# Patient Record
Sex: Male | Born: 1965 | Race: White | Hispanic: No | Marital: Single | Smoking: Never smoker
Health system: Southern US, Community
[De-identification: ages and names within clinical notes are randomized; demographics above are authoritative.]

## PROBLEM LIST (undated history)

## (undated) DIAGNOSIS — K219 Gastro-esophageal reflux disease without esophagitis: Secondary | ICD-10-CM

## (undated) DIAGNOSIS — J45909 Unspecified asthma, uncomplicated: Secondary | ICD-10-CM

## (undated) DIAGNOSIS — R030 Elevated blood-pressure reading, without diagnosis of hypertension: Secondary | ICD-10-CM

## (undated) HISTORY — PX: APPENDECTOMY: SHX54

---

## 2013-06-07 ENCOUNTER — Inpatient Hospital Stay: Payer: Self-pay | Admitting: Student

## 2013-06-07 LAB — URINALYSIS, COMPLETE
BACTERIA: NONE SEEN
BILIRUBIN, UR: NEGATIVE
BLOOD: NEGATIVE
GLUCOSE, UR: NEGATIVE mg/dL (ref 0–75)
Ketone: NEGATIVE
Leukocyte Esterase: NEGATIVE
NITRITE: NEGATIVE
Ph: 7 (ref 4.5–8.0)
Protein: NEGATIVE
RBC,UR: <1 /HPF (ref 0–5)
Specific Gravity: 1.004 (ref 1.003–1.030)
Squamous Epithelial: NONE SEEN
WBC UR: NONE SEEN /HPF (ref 0–5)

## 2013-06-07 LAB — CBC WITH DIFFERENTIAL/PLATELET
BASOS PCT: 0.5 %
Basophil #: 0.1 10*3/uL (ref 0.0–0.1)
Eosinophil #: 0 10*3/uL (ref 0.0–0.7)
Eosinophil %: 0.1 %
HCT: 39.9 % — AB (ref 40.0–52.0)
HGB: 13.3 g/dL (ref 13.0–18.0)
LYMPHS ABS: 0.4 10*3/uL — AB (ref 1.0–3.6)
LYMPHS PCT: 3.2 %
MCH: 31.8 pg (ref 26.0–34.0)
MCHC: 33.4 g/dL (ref 32.0–36.0)
MCV: 95 fL (ref 80–100)
Monocyte #: 0.9 x10 3/mm (ref 0.2–1.0)
Monocyte %: 7.1 %
NEUTROS ABS: 11.2 10*3/uL — AB (ref 1.4–6.5)
Neutrophil %: 89.1 %
PLATELETS: 165 10*3/uL (ref 150–440)
RBC: 4.19 10*6/uL — ABNORMAL LOW (ref 4.40–5.90)
RDW: 13.1 % (ref 11.5–14.5)
WBC: 12.6 10*3/uL — AB (ref 3.8–10.6)

## 2013-06-07 LAB — COMPREHENSIVE METABOLIC PANEL
ANION GAP: 6 — AB (ref 7–16)
AST: 49 U/L — AB (ref 15–37)
Albumin: 3.5 g/dL (ref 3.4–5.0)
Alkaline Phosphatase: 76 U/L
BUN: 10 mg/dL (ref 7–18)
Bilirubin,Total: 0.6 mg/dL (ref 0.2–1.0)
CALCIUM: 7.9 mg/dL — AB (ref 8.5–10.1)
CO2: 26 mmol/L (ref 21–32)
Chloride: 106 mmol/L (ref 98–107)
Creatinine: 1.21 mg/dL (ref 0.60–1.30)
EGFR (Non-African Amer.): >60
Glucose: 120 mg/dL — ABNORMAL HIGH (ref 65–99)
Osmolality: 276 (ref 275–301)
Potassium: 3.8 mmol/L (ref 3.5–5.1)
SGPT (ALT): 48 U/L (ref 12–78)
SODIUM: 138 mmol/L (ref 136–145)
Total Protein: 6.6 g/dL (ref 6.4–8.2)

## 2013-06-07 LAB — RAPID INFLUENZA A&B ANTIGENS

## 2013-06-07 LAB — TSH: Thyroid Stimulating Horm: 0.771 u[IU]/mL

## 2013-06-09 LAB — BETA STREP CULTURE(ARMC)

## 2013-06-09 LAB — CREATININE, SERUM
CREATININE: 1.11 mg/dL (ref 0.60–1.30)
EGFR (African American): >60
EGFR (Non-African Amer.): >60

## 2013-06-09 LAB — WBC: WBC: 13.2 10*3/uL — ABNORMAL HIGH (ref 3.8–10.6)

## 2013-06-12 LAB — CULTURE, BLOOD (SINGLE)

## 2014-06-30 NOTE — H&P (Signed)
PATIENT NAME:  Antonio Parks Parks, Antonio Parks MR#:  [id number] DATE OF BIRTH:  07/02/1965  DATE OF ADMISSION:  06/07/2013  ADMITTING PHYSICIAN: Enid Baasadhika Taylin Leder, MD  PRIMARY CARE PHYSICIAN: None.   CHIEF COMPLAINT: Cough, congestion and fever.   HISTORY OF PRESENT ILLNESS: Antonio Parks Parks is a 49 year old Caucasian male with past medical history significant for childhood asthma and alcohol abuse, who presents to the hospital secondary to sore throat, cough, congestion and fevers. He states his symptoms started with cough, congestion and sore throat with voice change about 2 days ago, but the fever started last night. This morning, he felt extremely weak, drained out and presents to the ER. He was noted to have a fever of 101 degrees Fahrenheit at home. He is tachycardic significantly in the 120s in spite of 2 liters of fluids. He did have a white count at 12.6. He is being admitted for systemic inflammatory response syndrome at this time. UA is pending. Chest x-ray is negative for any acute infection.   PAST MEDICAL HISTORY: Childhood asthma.   PAST SURGICAL HISTORY: Appendectomy.   ALLERGIES TO MEDICATIONS: PENICILLIN.   CURRENT HOME MEDICATIONS: Takes BC Powders over the counter 1 packet every day, but has stopped for 2 weeks now.   SOCIAL HISTORY: Lives at home by himself. Does not smoke. Drinks about 3 to 4 beers every day, and on weekends, he drinks about 18 beers. 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   FAMILY HISTORY: Parents healthy. Grandmother with diabetes and heart problems.   REVIEW OF SYSTEMS:  CONSTITUTIONAL: Positive for fever, fatigue and weakness.  EYES: No blurred vision, double vision, inflammation or glaucoma.  ENT: No tinnitus, ear pain, hearing loss, epistaxis or discharge. Positive for sore throat and hoarse voice.  RESPIRATORY: Positive for cough and congestion. No dyspnea on exertion. No asthma or COPD. No hemoptysis.  CARDIOVASCULAR: No chest pain, orthopnea,  edema, arrhythmia, palpitations or syncope.  GASTROINTESTINAL: No nausea, vomiting, diarrhea, abdominal pain, hematemesis or melena.  GENITOURINARY: No dysuria, hematuria, renal calculus, frequency or incontinence.  ENDOCRINE: No polyuria, nocturia, thyroid problems, heat or cold intolerance.  HEMATOLOGY: No anemia, easy bruising or bleeding.  SKIN: No acne, rash or lesions.  MUSCULOSKELETAL: Positive for generalized body aches, but no arthritis or gout.  NEUROLOGICAL: No numbness, weakness, CVA, TIA or seizures.  PSYCHOLOGICAL: No anxiety, insomnia or depression.   PHYSICAL EXAMINATION:  VITAL SIGNS: Temperature 98.3 degrees Fahrenheit, pulse 120, respirations 18, blood pressure 180/87, pulse of 100% on room air.  GENERAL: Well-built, well-nourished male lying in bed, not in any acute distress.  HEENT: Normocephalic, atraumatic. Pupils equal, round and reacting to light. Anicteric sclerae. Extraocular movements intact. Oropharynx is clear, without erythema, mass or exudates. Right ear has only mild fluid seen, and the left ear has fluid which is yellowish and possible otitis media.  NECK: Supple. No thyromegaly, JVD or carotid bruits. No lymphadenopathy. Normal range of motion without any pain.  RESPIRATORY: No wheeze or crackles. No use of accessory muscles for breathing. Normal breath sounds bilaterally.  CARDIOVASCULAR: S1 and S2, rapid rate and regular rhythm. No murmurs, rubs or gallops.  ABDOMEN: Soft, nontender, nondistended. No hepatosplenomegaly. Normal bowel sounds.  EXTREMITIES: No pedal edema. No clubbing or cyanosis. Has 2+ dorsalis pedis pulses palpable bilaterally.  SKIN: No acne, rash or lesions.  LYMPHATICS: No cervical or inguinal lymphadenopathy.  NEUROLOGICAL: Cranial nerves II through XII remain intact. No gross motor or sensory deficits.  PSYCHOLOGICAL: The patient is awake, alert, oriented  x3.   LABORATORY DATA:  WBC 12.6, hemoglobin 13.3, hematocrit 39.9, platelet  count 165.  Sodium 138, potassium 3.8, chloride 106, bicarbonate 26, BUN 10, creatinine 1.21, glucose of 120 and calcium of 7.9.  ALT 48, AST 49, alkaline phosphatase 76, total bilirubin 0.6, albumin of 3.5. TSH is 0.771.  Chest x-ray showing clear lung fields, hyperinflation. No acute cardiopulmonary disease. Urinalysis is negative for any infection.   ASSESSMENT AND PLAN: A 49 year old male with no significant past medical history, presents with fever and sore throat, noted to be tachycardic and elevated WBC.   1. Systemic inflammatory response syndrome. Flu test is pending, and also strep throat has been ordered. Also noted left otitis media early changes. Blood cultures done. IV fluids ordered and start Levaquin.  2. Melena, uses BC Powders as outpatient, recently stopped for two weeks. Stool normal color now. Hemoglobin is stable. Counseled against BC Powders. Will just start on Protonix for reflux symptoms while in the hospital.  3. Alcohol abuse. Will place on CIWA protocol. No history of any withdrawals.   CODE STATUS: Full code.  TIME SPENT ON ADMISSION: 50 minutes.   ____________________________ Enid Baas, MD rk:lb D: 06/07/2013 11:51:42 ET T: 06/07/2013 12:28:04 ET JOB#: [id number]  cc: Enid Baas, MD, <Dictator> Enid Baas MD ELECTRONICALLY SIGNED 06/15/2013 14:04

## 2014-06-30 NOTE — Discharge Summary (Signed)
PATIENT NAME:  Antonio Parks, Antonio Parks MR#:  [id number] DATE OF BIRTH:  31-Jul-1965  DATE OF ADMISSION:  06/07/2013  DATE OF DISCHARGE:  06/09/2013  CHIEF COMPLAINT:  Cough, congestion, and fever.  DISCHARGE DIAGNOSES: 1.  Systemic inflammatory response syndrome. 2.  Possible left otitis media.  3.  Constipation.  4.  Black stools x 1, possible melena.   5.  Alcohol abuse.   DISCHARGE MEDICATIONS:  1.  Levaquin 500 mg daily for 4 days, then stop.  2.  Omeprazole 20 mg daily for 21 days.  3.  Dok Plus 50/8.6 mg 2 tabs once a day at bedtime.   INSTRUCTIONS: Diet: Regular. Activity as tolerated. Please follow with PCP within 1 to 2 weeks. If you have any further dark stools, call your doctor right away.   DISPOSITION: Home.   SIGNIFICANT LABS AND IMAGING: Blood cultures on arrival: No growth to date. Rapid flu negative. Strep in the throat was negative. Urinalysis does not suggest an infection. Initial white count of 12.6, hemoglobin 13.3. TSH 0.771. X-ray of the chest, PA and lateral:  No active disease, minimal hyperinflation.   HISTORY OF PRESENT ILLNESS AND HOSPITAL COURSE: For full details of the H and P, please see the dictation on April 1 by Dr. Nemiah Commander, but briefly, this is a pleasant, 49 year old with no significant chronic medical issues, history of childhood asthma, and alcohol abuse, who came in with sore throat, cough, congestion, and fevers. He did have SIRS by criteria, with fever, tachycardia, and some leukocytosis, but negative x-ray of the chest. He was admitted to the hospitalist service. Of note, he also had been drinking 3 or 4 beers daily, and much more over the weekend, about 18 beers. He was admitted to the hospitalist service. On arrival, he did appear to have left otitis media. The systemic inflammatory response syndrome could have been viral in nature, and otitis media a complication. However, here, the blood cultures have been negative. Urinalysis was negative, and rapid flu  and strep throat have been negative. Symptoms have resolved. He has been afebrile, with significant improvement in his tachycardia from 120s. He has been placed on CIWA, but has not shown significant withdrawal from alcohol. His pulse rate is better, and he will be discharged with the above medications. Of note, the patient did have a complaint of black stools x 1, which has not recurred. He was counseled to cut back on his alcohol. He has also been taking excessive BC powder, and he was counseled against that, and he was instructed if he has recurrent black stools to call Doctor. He will be discharged with some PPI, and he did complain of constipation. Will be discharged on some stool softeners.   PHYSICAL EXAMINATION: VITAL SIGNS: On the day of discharge, temperature is 98.1, pulse rate 97, respiratory rate 20, blood pressure 136/92, O2 sat 97% on room air.  GENERAL: The patient is a pleasant male lying in bed without significant tremors, talking in full sentences.  LUNGS: Pretty much clear.  ABDOMEN: Soft nontender. Normal S1, S2. A little tachycardic.  EXTREMITIES: Do not exhibit any edema.  NEUROLOGIC:  He is awake, alert, oriented x 3, without significant tremors or anxiety.   PLAN: He will be discharged to outpatient followup.   Total time spent is 32 minutes.   The patient is a FULL CODE.    ____________________________ Krystal Eaton, MD sa:mr D: 06/09/2013 15:20:45 ET T: 06/09/2013 21:23:03 ET JOB#: [id number]  cc: Krystal Eaton, MD, <Dictator> [facility]  MD ELECTRONICALLY SIGNED 07/06/2013 14:17

## 2015-09-11 ENCOUNTER — Emergency Department
Admission: EM | Admit: 2015-09-11 | Discharge: 2015-09-11 | Disposition: A | Discharge status: Home / Self Care | Payer: PRIVATE HEALTH INSURANCE | Attending: Emergency Medicine | Admitting: Emergency Medicine

## 2015-09-11 DIAGNOSIS — W260XXA Contact with knife, initial encounter: Secondary | ICD-10-CM | POA: Insufficient documentation

## 2015-09-11 DIAGNOSIS — S61012A Laceration without foreign body of left thumb without damage to nail, initial encounter: Secondary | ICD-10-CM

## 2015-09-11 DIAGNOSIS — Y929 Unspecified place or not applicable: Secondary | ICD-10-CM | POA: Diagnosis not present

## 2015-09-11 DIAGNOSIS — Y9389 Activity, other specified: Secondary | ICD-10-CM | POA: Diagnosis not present

## 2015-09-11 DIAGNOSIS — Y999 Unspecified external cause status: Secondary | ICD-10-CM | POA: Diagnosis not present

## 2015-09-11 MED ORDER — TETANUS-DIPHTH-ACELL PERTUSSIS 5-2.5-18.5 LF-MCG/0.5 IM SUSP
0.5000 mL | Freq: Once | INTRAMUSCULAR | Status: AC
  Administered 2015-09-11: 0.5 mL via INTRAMUSCULAR
  Filled 2015-09-11: qty 0.5

## 2015-09-11 NOTE — ED Provider Notes (Signed)
[facility] Emergency Department Provider Note ____________________________________________  Time seen: 2130  I have reviewed the triage vital signs and the nursing notes.  HISTORY  Chief Complaint  Laceration  HPI Karma GanjaMichael Leach is a 50 y.o. male visits to the ED for evaluation of the left thumb laceration accidentally obtained while he was using a steak knife to cut tomatoes. He denies any other injury at this time. The patient is unclear for current tetanus status. He presents to the ED with a wound dressed with a Band-Aid and bloodsoaked gauze. He denies any significant pain at this time.  No past medical history on file.  There are no active problems to display for this patient.  No past surgical history on file.  No current outpatient prescriptions on file.  Allergies Penicillins  No family history on file.  Social History Social History  Substance Use Topics  . Smoking status: Not on file  . Smokeless tobacco: Not on file  . Alcohol Use: Not on file   Review of Systems  Constitutional: Negative for fever. Musculoskeletal: Negative for back pain. Skin: Negative for rash. Left finger laceration. Neurological: Negative for headaches, focal weakness or numbness. ____________________________________________  PHYSICAL EXAM:  VITAL SIGNS: ED Triage Vitals  Enc Vitals Group     BP 09/11/15 2025 176/114 mmHg     Pulse Rate 09/11/15 2025 84     Resp 09/11/15 2025 18     Temp 09/11/15 2025 98.2 F (36.8 C)     Temp Source 09/11/15 2025 Oral     SpO2 09/11/15 2025 99 %     Weight 09/11/15 2025 170 lb (77.111 kg)     Height 09/11/15 2025 5\' 11"  (1.803 m)     Head Cir --      Peak Flow --      Pain Score 09/11/15 2109 0     Pain Loc --      Pain Edu? --      Excl. in GC? --    Constitutional: Alert and oriented. Well appearing and in no distress. Head: Normocephalic and atraumatic. Respiratory: Normal respiratory effort.   Musculoskeletal: Normal composite fist and the left thumb reveals a superficial one similar flap laceration of the distal plantar's fat pad. Wound is clean and bleeding is currently controlled. Nontender with normal range of motion in all extremities.  Neurologic:  Normal gross sensation. No gross focal neurologic deficits are appreciated. Skin:  Skin is warm, dry and intact. No rash noted. ___________________________________________  PROCEDURES  Tdap  LACERATION REPAIR Performed by: Lissa HoardMenshew, Whalen Trompeter V Bacon Authorized by: Lissa HoardMenshew, Dearis Danis V Bacon Consent: Verbal consent obtained. Risks and benefits: risks, benefits and alternatives were discussed Consent given by: patient Patient identity confirmed: provided demographic data Prepped and Draped in normal sterile fashion Wound explored  Laceration Location: left thumb  Laceration Length: 1 cm  No Foreign Bodies seen or palpated  Anesthesia: none  Irrigation method: syringe Amount of cleaning: standard  Skin closure: cyanoacrylate wound adhesive  Patient tolerance: Patient tolerated the procedure well with no immediate complications. ____________________________________________  INITIAL IMPRESSION / ASSESSMENT AND PLAN / ED COURSE  Patient with a superficial laceration to the left thumb status post wound adhesive closure. Patient's wound is dressed as appropriate and is discharged following a tetanus booster. He should follow-up with his primary care provider or the local community clinics for ongoing symptom management. ____________________________________________  FINAL CLINICAL IMPRESSION(S) / ED DIAGNOSES  Final diagnoses:  Thumb laceration, left, initial encounter  Charlesetta IvoryJenise V Bacon DarienMenshew, PA-C 09/11/15 2202  Jeanmarie PlantJames A McShane, MD 09/11/15 [phone]

## 2015-09-11 NOTE — Discharge Instructions (Signed)
Laceration Care, Adult  A laceration is a cut that goes through all layers of the skin. The cut also goes into the tissue that is right under the skin. Some cuts heal on their own. Others need to be closed with stitches (sutures), staples, skin adhesive strips, or wound glue. Taking care of your cut lowers your risk of infection and helps your cut to heal better.  HOW TO TAKE CARE OF YOUR CUT  For stitches or staples:  · Keep the wound clean and dry.  · If you were given a bandage (dressing), you should change it at least one time per day or as told by your doctor. You should also change it if it gets wet or dirty.  · Keep the wound completely dry for the first 24 hours or as told by your doctor. After that time, you may take a shower or a bath. However, make sure that the wound is not soaked in water until after the stitches or staples have been removed.  · Clean the wound one time each day or as told by your doctor:    Wash the wound with soap and water.    Rinse the wound with water until all of the soap comes off.    Pat the wound dry with a clean towel. Do not rub the wound.  · After you clean the wound, put a thin layer of antibiotic ointment on it as told by your doctor. This ointment:    Helps to prevent infection.    Keeps the bandage from sticking to the wound.  · Have your stitches or staples removed as told by your doctor.  If your doctor used skin adhesive strips:   · Keep the wound clean and dry.  · If you were given a bandage, you should change it at least one time per day or as told by your doctor. You should also change it if it gets dirty or wet.  · Do not get the skin adhesive strips wet. You can take a shower or a bath, but be careful to keep the wound dry.  · If the wound gets wet, pat it dry with a clean towel. Do not rub the wound.  · Skin adhesive strips fall off on their own. You can trim the strips as the wound heals. Do not remove any strips that are still stuck to the wound. They will  fall off after a while.  If your doctor used wound glue:  · Try to keep your wound dry, but you may briefly wet it in the shower or bath. Do not soak the wound in water, such as by swimming.  · After you take a shower or a bath, gently pat the wound dry with a clean towel. Do not rub the wound.  · Do not do any activities that will make you really sweaty until the skin glue has fallen off on its own.  · Do not apply liquid, cream, or ointment medicine to your wound while the skin glue is still on.  · If you were given a bandage, you should change it at least one time per day or as told by your doctor. You should also change it if it gets dirty or wet.  · If a bandage is placed over the wound, do not let the tape for the bandage touch the skin glue.  · Do not pick at the glue. The skin glue usually stays on for 5-10 days. Then, it   falls off of the skin.  General Instructions   · To help prevent scarring, make sure to cover your wound with sunscreen whenever you are outside after stitches are removed, after adhesive strips are removed, or when wound glue stays in place and the wound is healed. Make sure to wear a sunscreen of at least 30 SPF.  · Take over-the-counter and prescription medicines only as told by your doctor.  · If you were given antibiotic medicine or ointment, take or apply it as told by your doctor. Do not stop using the antibiotic even if your wound is getting better.  · Do not scratch or pick at the wound.  · Keep all follow-up visits as told by your doctor. This is important.  · Check your wound every day for signs of infection. Watch for:    Redness, swelling, or pain.    Fluid, blood, or pus.  · Raise (elevate) the injured area above the level of your heart while you are sitting or lying down, if possible.  GET HELP IF:  · You got a tetanus shot and you have any of these problems at the injection site:    Swelling.    Very bad pain.    Redness.    Bleeding.  · You have a fever.  · A wound that was  closed breaks open.  · You notice a bad smell coming from your wound or your bandage.  · You notice something coming out of the wound, such as wood or glass.  · Medicine does not help your pain.  · You have more redness, swelling, or pain at the site of your wound.  · You have fluid, blood, or pus coming from your wound.  · You notice a change in the color of your skin near your wound.  · You need to change the bandage often because fluid, blood, or pus is coming from the wound.  · You start to have a new rash.  · You start to have numbness around the wound.  GET HELP RIGHT AWAY IF:  · You have very bad swelling around the wound.  · Your pain suddenly gets worse and is very bad.  · You notice painful lumps near the wound or on skin that is anywhere on your body.  · You have a red streak going away from your wound.  · The wound is on your hand or foot and you cannot move a finger or toe like you usually can.  · The wound is on your hand or foot and you notice that your fingers or toes look pale or bluish.     This information is not intended to replace advice given to you by your health care provider. Make sure you discuss any questions you have with your health care provider.     Document Released: 08/12/2007 Document Revised: 07/10/2014 Document Reviewed: 02/19/2014  Elsevier Interactive Patient Education ©2016 [employer]

## 2015-09-11 NOTE — ED Notes (Signed)
Laceration to left thumb while cutting a tomato.

## 2015-09-11 NOTE — ED Notes (Signed)
Pt presents to ED with c/o laceration to left thumb while cutting tomatoes with a steak knife. Wound is not visualized at this time, pt presents with gauze and kerlex wrapped around wound; bleeding controlled at this time. Pt admits to ETOH use prior to injury. Pt unsure of last Tetanus shot, but reports last time he was seen in the ED was 3+ yrs ago.

## 2016-12-24 ENCOUNTER — Emergency Department: Payer: PRIVATE HEALTH INSURANCE

## 2016-12-24 ENCOUNTER — Emergency Department
Admission: EM | Admit: 2016-12-24 | Discharge: 2016-12-24 | Disposition: A | Discharge status: Home / Self Care | Payer: PRIVATE HEALTH INSURANCE | Attending: Emergency Medicine | Admitting: Emergency Medicine

## 2016-12-24 DIAGNOSIS — E860 Dehydration: Secondary | ICD-10-CM | POA: Insufficient documentation

## 2016-12-24 DIAGNOSIS — R197 Diarrhea, unspecified: Secondary | ICD-10-CM | POA: Diagnosis present

## 2016-12-24 DIAGNOSIS — K5289 Other specified noninfective gastroenteritis and colitis: Secondary | ICD-10-CM | POA: Diagnosis not present

## 2016-12-24 DIAGNOSIS — R112 Nausea with vomiting, unspecified: Secondary | ICD-10-CM | POA: Diagnosis not present

## 2016-12-24 HISTORY — DX: Unspecified asthma, uncomplicated: J45.909

## 2016-12-24 LAB — COMPREHENSIVE METABOLIC PANEL
ALBUMIN: 2.9 g/dL — AB (ref 3.5–5.0)
ALT: 24 U/L (ref 17–63)
ANION GAP: 10 (ref 5–15)
AST: 20 U/L (ref 15–41)
Alkaline Phosphatase: 85 U/L (ref 38–126)
BUN: 9 mg/dL (ref 6–20)
CHLORIDE: 92 mmol/L — AB (ref 101–111)
CO2: 27 mmol/L (ref 22–32)
Calcium: 8.2 mg/dL — ABNORMAL LOW (ref 8.9–10.3)
Creatinine, Ser: 1.06 mg/dL (ref 0.61–1.24)
GFR calc Af Amer: >60 mL/min (ref >60)
GLUCOSE: 118 mg/dL — AB (ref 65–99)
POTASSIUM: 3.2 mmol/L — AB (ref 3.5–5.1)
Sodium: 129 mmol/L — ABNORMAL LOW (ref 135–145)
TOTAL PROTEIN: 7.1 g/dL (ref 6.5–8.1)
Total Bilirubin: 0.8 mg/dL (ref 0.3–1.2)

## 2016-12-24 LAB — URINALYSIS, COMPLETE (UACMP) WITH MICROSCOPIC
BILIRUBIN URINE: NEGATIVE
Glucose, UA: NEGATIVE mg/dL
Ketones, ur: 20 mg/dL — AB
NITRITE: NEGATIVE
PROTEIN: 100 mg/dL — AB
Specific Gravity, Urine: 1.021 (ref 1.005–1.030)
pH: 6 (ref 5.0–8.0)

## 2016-12-24 LAB — CBC
HEMATOCRIT: 37.5 % — AB (ref 40.0–52.0)
HEMOGLOBIN: 12.8 g/dL — AB (ref 13.0–18.0)
MCH: 31.8 pg (ref 26.0–34.0)
MCHC: 34.1 g/dL (ref 32.0–36.0)
MCV: 93.4 fL (ref 80.0–100.0)
Platelets: 472 10*3/uL — ABNORMAL HIGH (ref 150–440)
RBC: 4.02 MIL/uL — ABNORMAL LOW (ref 4.40–5.90)
RDW: 12.8 % (ref 11.5–14.5)
WBC: 17.4 10*3/uL — AB (ref 3.8–10.6)

## 2016-12-24 LAB — MAGNESIUM: MAGNESIUM: 2.1 mg/dL (ref 1.7–2.4)

## 2016-12-24 LAB — LIPASE, BLOOD: LIPASE: 68 U/L — AB (ref 11–51)

## 2016-12-24 MED ORDER — IOPAMIDOL (ISOVUE-300) INJECTION 61%
100.0000 mL | Freq: Once | INTRAVENOUS | Status: AC | PRN
  Administered 2016-12-24: 100 mL via INTRAVENOUS

## 2016-12-24 MED ORDER — SODIUM CHLORIDE 0.9 % IV BOLUS (SEPSIS)
1000.0000 mL | Freq: Once | INTRAVENOUS | Status: AC
Start: 2016-12-24 — End: 2016-12-24
  Administered 2016-12-24: 1000 mL via INTRAVENOUS

## 2016-12-24 MED ORDER — IOPAMIDOL (ISOVUE-300) INJECTION 61%
30.0000 mL | Freq: Once | INTRAVENOUS | Status: AC | PRN
  Administered 2016-12-24: 30 mL via ORAL

## 2016-12-24 MED ORDER — ONDANSETRON HCL 4 MG PO TABS: 4.0000 mg | ORAL_TABLET | Freq: Three times a day (TID) | ORAL | 0 refills | Status: DC | PRN

## 2016-12-24 MED ORDER — POTASSIUM CHLORIDE CRYS ER 20 MEQ PO TBCR
40.0000 meq | EXTENDED_RELEASE_TABLET | Freq: Once | ORAL | Status: AC
  Administered 2016-12-24: 40 meq via ORAL
  Filled 2016-12-24: qty 2

## 2016-12-24 MED ORDER — SODIUM CHLORIDE 0.9 % IV BOLUS (SEPSIS)
1000.0000 mL | Freq: Once | INTRAVENOUS | Status: AC
  Administered 2016-12-24: 1000 mL via INTRAVENOUS

## 2016-12-24 NOTE — ED Triage Notes (Signed)
Pt reports that he has had N/V/D for the last several days he went to urgent care they gave him Immodium and it has not helped any. Pt reports that he has been able to drink a little with out it coming back up.

## 2016-12-24 NOTE — ED Notes (Signed)
Pt ambulatory to wheel chair at discharge. Pt and family member verbalized understanding of discharge instruction, prescriptions and follow-up. VSS. Skin warm and dry. A&O x4.

## 2016-12-24 NOTE — ED Notes (Signed)
First Nurse Note:  Patient reports nausea and vomiting x 2 weeks.  Patient states, "I think I'm really dehydrated."  Patient was seen at Urgent Care recently and started on antibiotics but patient states he was having vomiting episodes prior to starting the antibiotics.

## 2016-12-24 NOTE — ED Provider Notes (Addendum)
[facility] Emergency Department Provider Note  ____________________________________________   I have reviewed the triage vital signs and the nursing notes.   HISTORY  Chief Complaint Dehydration    HPI Antonio Parks is a 51 y.o. male states he is healthy, he does drink 6 or 7 beers a night but has not done so in 2 weeks, he has been having diarrhea for the last 2 weeks. He states sometimes several times a day sometimes he can go 8 hours and then have brown liquidy diarrhea. No recent travel. He states he has vomited twice during the course of this sickness but he feels generally weak and has not been doing good job and stay hydrated. He went to an urgent care 2 or 3 days ago he states, those records are not available to me, at that time, he was given he states "to antibiotics" but they "ripped up his stomach" so he stopped taking them. He denies any melena or bright red blood per rectum or hematemesis. he denies any ongoing abdominal pain. He feels he has lost some weight during this episode. He has never had any alcohol withdrawal symptoms she states. And he states is been 2 weeks since his last alcohol. He denies any fever or chills. He states he stopped having a called because of the vomiting and diarrhea not the other way around. He had no antecedent weight loss. denies any fevers, no camping no significant sick contacts, works as Occupational hygienist  No past medical history on file.  There are no active problems to display for this patient.   No past surgical history on file.  Prior to Admission medications   Not on File    Allergies Penicillins  No family history on file.  Social History Social History  Substance Use Topics  . Smoking status: Not on file  . Smokeless tobacco: Not on file  . Alcohol use Not on file    Review of Systems  Constitutional: No fever/chills Eyes: No visual changes. ENT: No sore throat. No stiff neck no neck  pain Cardiovascular: Denies chest pain. Respiratory: Denies shortness of breath. Gastrointestinal:  see history of present illnessGenitourinary: Negative for dysuria. Musculoskeletal: Negative lower extremity swelling Skin: Negative for rash. Neurological: Negative for severe headaches, focal weakness or numbness.   ____________________________________________   PHYSICAL EXAM:  VITAL SIGNS: ED Triage Vitals  Enc Vitals Group     BP 12/24/16 1555 135/89     Pulse Rate 12/24/16 1555 (!) 102     Resp 12/24/16 1555 18     Temp 12/24/16 1555 98.7 F (37.1 C)     Temp Source 12/24/16 1555 Oral     SpO2 12/24/16 1555 99 %     Weight 12/24/16 1555 155 lb (70.3 kg)     Height 12/24/16 1555 5\' 11"  (1.803 m)     Head Circumference --      Peak Flow --      Pain Score 12/24/16 1607 10     Pain Loc --      Pain Edu? --      Excl. in GC? --     Constitutional: Alert and oriented. Well appearing and in no acute distress. Eyes: Conjunctivae are normal Head: Atraumatic HEENT: No congestion/rhinnorhea. Mucous membranes are dry.  Oropharynx non-erythematous Neck:   Nontender with no meningismus, no masses, no stridor Cardiovascular: Normal rate, regular rhythm. Grossly normal heart sounds.  Good peripheral circulation. Respiratory: Normal respiratory effort.  No retractions. Lungs CTAB.  Abdominal: Soft and nontender. No distention. No guarding no rebound Back:  There is no focal tenderness or step off.  there is no midline tenderness there are no lesions noted. there is no CVA tenderness Musculoskeletal: No lower extremity tenderness, no upper extremity tenderness. No joint effusions, no DVT signs strong distal pulses no edema Neurologic:  Normal speech and language. No gross focal neurologic deficits are appreciated.  Skin:  Skin is warm, dry and intact. No rash noted. Psychiatric: Mood and affect are normal. Speech and behavior are  normal.  ____________________________________________   LABS (all labs ordered are listed, but only abnormal results are displayed)  Labs Reviewed  LIPASE, BLOOD - Abnormal; Notable for the following:       Result Value   Lipase 68 (*)    All other components within normal limits  COMPREHENSIVE METABOLIC PANEL - Abnormal; Notable for the following:    Sodium 129 (*)    Potassium 3.2 (*)    Chloride 92 (*)    Glucose, Bld 118 (*)    Calcium 8.2 (*)    Albumin 2.9 (*)    All other components within normal limits  CBC - Abnormal; Notable for the following:    WBC 17.4 (*)    RBC 4.02 (*)    Hemoglobin 12.8 (*)    HCT 37.5 (*)    Platelets 472 (*)    All other components within normal limits  URINALYSIS, COMPLETE (UACMP) WITH MICROSCOPIC - Abnormal; Notable for the following:    Color, Urine AMBER (*)    APPearance CLEAR (*)    Hgb urine dipstick SMALL (*)    Ketones, ur 20 (*)    Protein, ur 100 (*)    Leukocytes, UA TRACE (*)    Bacteria, UA RARE (*)    Squamous Epithelial / LPF 0-5 (*)    All other components within normal limits  GASTROINTESTINAL PANEL BY PCR, STOOL (REPLACES STOOL CULTURE)  C DIFFICILE QUICK SCREEN W PCR REFLEX  MAGNESIUM    Pertinent labs  results that were available during my care of the patient were reviewed by me and considered in my medical decision making (see chart for details). ____________________________________________  EKG  I personally interpreted any EKGs ordered by me or triage  ____________________________________________  RADIOLOGY  Pertinent labs & imaging results that were available during my care of the patient were reviewed by me and considered in my medical decision making (see chart for details). If possible, patient and/or family made aware of any abnormal findings. ____________________________________________    PROCEDURES  Procedure(s) performed: None  Procedures  Critical Care performed:  None  ____________________________________________   INITIAL IMPRESSION / ASSESSMENT AND PLAN / ED COURSE  Pertinent labs & imaging results that were available during my care of the patient were reviewed by me and considered in my medical decision making (see chart for details).  2. Diarrhea and dehydration. Sodium is somewhat low, chloride is somewhat low, likely reflecting GI losses. He also has elevated lipase which is of concern and his platelet count is very high. This certainly could be an acute phase reactant, elevated white count noted as well. No indication for antibiotics in his abdomen is benign. We will send stool cultures and C. difficile although no real risk factors for C. difficile. We will give him copious IV fluid and potassium repletion which I think she probably also correct his hyponatremia which is borderline. In addition, I think in this patient I will obtain a CT  scan I'm concerned about his elevated lipase, and his elevated platelet count, we will see if there is an oncologic process. Liver function test however are reassuring, and patient is in no acute distress. If workup is negative after IV fluids is my hope and get him safely home.  ----------------------------------------- 8:16 PM on 12/24/2016 -----------------------------------------  patient feels "100% better". He has been unable to give us a stool sample despite being here for many hours. No evidence of alcohol withdrawal. Workup is reassuring including CT scan. We have replaced his potassium. I've advised him to hold on antibiotics until we can make it better diagnosis otherwise been feeling sick. Some of this could've been from stopping drinking. The event, we will give him close outpatient follow-up. I did offer admission but he refuses. We will discharge him with his mother and he will follow closely with PCP. Patient has no diarrhea or vomiting and is tolerating by mouth well here CT is reassuring     ____________________________________________   FINAL CLINICAL IMPRESSION(S) / ED DIAGNOSES  Final diagnoses:  None      This chart was dictated using voice recognition software.  Despite best efforts to proofread,  errors can occur which can change meaning.      Jeanmarie PlantMcShane, Iola Turri A, MD 12/24/16 1735    Jeanmarie PlantMcShane, Nazeer Romney A, MD 12/24/16 2017

## 2017-01-18 ENCOUNTER — Encounter: Payer: Self-pay | Admitting: Emergency Medicine

## 2017-01-18 ENCOUNTER — Emergency Department
Admission: EM | Admit: 2017-01-18 | Discharge: 2017-01-18 | Disposition: A | Discharge status: Home / Self Care | Payer: PRIVATE HEALTH INSURANCE | Attending: Emergency Medicine | Admitting: Emergency Medicine

## 2017-01-18 ENCOUNTER — Other Ambulatory Visit: Payer: Self-pay

## 2017-01-18 DIAGNOSIS — J45909 Unspecified asthma, uncomplicated: Secondary | ICD-10-CM | POA: Diagnosis not present

## 2017-01-18 DIAGNOSIS — R21 Rash and other nonspecific skin eruption: Secondary | ICD-10-CM | POA: Diagnosis present

## 2017-01-18 DIAGNOSIS — L0231 Cutaneous abscess of buttock: Secondary | ICD-10-CM | POA: Insufficient documentation

## 2017-01-18 MED ORDER — SULFAMETHOXAZOLE-TRIMETHOPRIM 800-160 MG PO TABS: 1.0000 | ORAL_TABLET | Freq: Two times a day (BID) | ORAL | 0 refills | Status: DC

## 2017-01-18 MED ORDER — OXYCODONE-ACETAMINOPHEN 5-325 MG PO TABS
1.0000 | ORAL_TABLET | Freq: Once | ORAL | Status: AC
  Administered 2017-01-18: 1 via ORAL
  Filled 2017-01-18: qty 1

## 2017-01-18 MED ORDER — LIDOCAINE HCL (PF) 1 % IJ SOLN
5.0000 mL | Freq: Once | INTRAMUSCULAR | Status: AC
  Administered 2017-01-18: 5 mL
  Filled 2017-01-18: qty 5

## 2017-01-18 NOTE — ED Triage Notes (Signed)
Pt c/o pain to left buttocks. Has abscess to buttocks. Sent by PCP because PCP wanted drained here.  Pt denies it at rectum.  Reports HR and BP elevated because of pain.  Has ran ST in past.  Ambulatory. No fevers.

## 2017-01-18 NOTE — ED Provider Notes (Signed)
[facility] Emergency Department Provider Note   ____________________________________________   I have reviewed the triage vital signs and the nursing notes.   HISTORY  Chief Complaint Abscess    HPI Antonio Parks is a 51 y.o. male presents to the emergency department with abscess along the right buttock. Patient seen by his PCP. PCP referred him to the emergency department for incision and drainage of the abscess.  Patient was seen in mid October emergency department for episode of diarrhea patient is unsure if the particular episode led to development of current abscess and symptoms.  Patient denies any past history of abscesses.  Patient noted initial symptoms began 2 weeks ago and worsening of pain and size of abscesses worsened over the last 24 hours.  Patient denies fever, chills, nausea, vomiting or headache.  Does note increase heart rate and blood pressure related to current pain and discomfort. Patient denies vision changes, chest pain, chest tightness or shortness of breath, abdominal pain.  Past Medical History:  Diagnosis Date  . Asthma     There are no active problems to display for this patient.   History reviewed. No pertinent surgical history.  Prior to Admission medications   Medication Sig Start Date End Date Taking? Authorizing Provider  ondansetron (ZOFRAN) 4 MG tablet Take 1 tablet (4 mg total) by mouth every 8 (eight) hours as needed for nausea or vomiting. 12/24/16   Jeanmarie PlantMcShane, James A, MD  sulfamethoxazole-trimethoprim (BACTRIM DS,SEPTRA DS) 800-160 MG tablet Take 1 tablet 2 (two) times daily by mouth. 01/18/17   Sherena Machorro M, PA-C    Allergies Penicillins  History reviewed. No pertinent family history.  Social History Social History   Tobacco Use  . Smoking status: Never Smoker  . Smokeless tobacco: Never Used  Substance Use Topics  . Alcohol use: Yes    Comment: 4-5 beer per day  . Drug use: No    Review of  Systems Constitutional: Negative for fever/chills Cardiovascular: Denies chest pain. Respiratory: Denies cough. Denies shortness of breath. Gastrointestinal: No abdominal pain.  No nausea, vomiting, diarrhea. Skin: Negative for rash. Abscess along right buttock. Neurological: Negative for headaches.  ____________________________________________   PHYSICAL EXAM:  VITAL SIGNS: Patient Vitals for the past 24 hrs:  BP Temp Temp src Pulse Resp SpO2 Height Weight  01/18/17 1610 (!) 155/103 - - (!) 115 - - - -  01/18/17 1602 - - - - - - 5\' 11"  (1.803 m) 72.6 kg (160 lb)  01/18/17 1601 (!) 167/105 97.6 F (36.4 C) Oral (!) 126 20 100 % - -    Constitutional: Alert and oriented. Well appearing and in no acute distress.  Eyes: Conjunctivae are normal. PERRL. EOMI  Head: Normocephalic and atraumatic. Cardiovascular: Normal rate, regular rhythm. Good peripheral circulation. Respiratory: Normal respiratory effort without tachypnea or retractions. Lungs CTAB.  Gastrointestinal: Bowel sounds 4 quadrants. Soft and nontender to palpation. Musculoskeletal: Nontender with normal range of motion in all extremities. Neurologic: Normal speech and language Skin:  Skin is warm, dry and intact. No rash noted. Abscess along right buttock not in affecting rectal tissue. Erythema and swelling 4 cm diameter with induration. Area of fluctuance 0.5 cm in diameter.  Psychiatric: Mood and affect are normal. Speech and behavior are normal. Patient exhibits appropriate insight and judgement.  ____________________________________________   LABS (all labs ordered are listed, but only abnormal results are displayed)  Labs Reviewed - No data to display ____________________________________________  EKG none ____________________________________________  RADIOLOGY none ____________________________________________  PROCEDURES  Procedure(s) performed: no    Critical Care performed:  no ____________________________________________   INITIAL IMPRESSION / ASSESSMENT AND PLAN / ED COURSE  Pertinent labs & imaging results that were available during my care of the patient were reviewed by me and considered in my medical decision making (see chart for details).   Patient presented with abscess located along right buttock. The abscess I&D without complication. Patient will be prescribed bactrim for antibiotic coverage. Patient instructed to keep wound clean, dry and covered with a nonocclusive dressing. Also instructed to return in 2 days for a wound check.  Patient reports blood pressure elevates when he is in pain.  Reassessment was reassuring at the time of discharge.  Patient informed of clinical course, understand medical decision-making process, and agree with plan.  ____________________________________________   FINAL CLINICAL IMPRESSION(S) / ED DIAGNOSES  Final diagnoses:  Abscess of buttock, right       NEW MEDICATIONS STARTED DURING THIS VISIT:  This SmartLink is deprecated. Use AVSMEDLIST instead to display the medication list for a patient.   Note:  This document was prepared using Dragon voice recognition software and may include unintentional dictation errors.    Clois ComberLittle, Lorean Ekstrand M, PA-C 01/18/17 1801    Phineas SemenGoodman, Graydon, MD 01/19/17 [phone]

## 2017-01-18 NOTE — ED Notes (Signed)
Pt states abscess x2 weeks since seen here for diarrhea 3 weeks ago. Pt reports minimal drainage from wound

## 2017-01-18 NOTE — Discharge Instructions (Signed)
Return for wound check in 2 days at your primary care provider or emergency department.   Do not remove the packing in the wound before you have the wound check.

## 2017-01-22 ENCOUNTER — Encounter: Payer: Self-pay | Admitting: *Deleted

## 2017-01-25 ENCOUNTER — Ambulatory Visit: Payer: PRIVATE HEALTH INSURANCE | Admitting: General Surgery

## 2017-01-25 ENCOUNTER — Encounter: Payer: Self-pay | Admitting: General Surgery

## 2017-01-25 VITALS — BP 118/68 | HR 78 | Resp 14 | Ht 71.0 in | Wt 158.0 lb

## 2017-01-25 DIAGNOSIS — K610 Anal abscess: Secondary | ICD-10-CM

## 2017-01-25 NOTE — Patient Instructions (Addendum)
Finish antibiotics. Follow up in 3 weeks.

## 2017-01-25 NOTE — Progress Notes (Signed)
Patient ID: Antonio Parks, male   DOB: 04/19/1965, 51 y.o.   MRN: [id number]  Chief Complaint  Patient presents with  . Other    abscess    HPI Antonio CharyMichael Dwayne Martucci is a 51 y.o. male here for evaluation of a perianal abscess. He was see in the ER on 01/18/17 and it was drained then. He is currently on antibiotics, Bactrim. He reports the area has been draining all week and the pain is improved. He had a lot of pain with  bowel movements and admits some bleeding. He was seen in the hospital on 12/24/16 for diarrhea which may have lead to this. Bowel movements regular. No history of colonoscopy. No family history of polyps or colon cancer.  HPI  Past Medical History:  Diagnosis Date  . Asthma     Past Surgical History:  Procedure Laterality Date  . APPENDECTOMY  as child    Family History  Problem Relation Age of Onset  . Skin cancer Mother   . Bone cancer Maternal Aunt     Social History Social History   Tobacco Use  . Smoking status: Never Smoker  . Smokeless tobacco: Current User    Types: Chew  Substance Use Topics  . Alcohol use: Yes    Comment: 4-5 beer per day  . Drug use: No    Allergies  Allergen Reactions  . Penicillins Rash    Current Outpatient Medications  Medication Sig Dispense Refill  . sulfamethoxazole-trimethoprim (BACTRIM DS,SEPTRA DS) 800-160 MG tablet Take 1 tablet 2 (two) times daily by mouth. 20 tablet 0   No current facility-administered medications for this visit.     Review of Systems Review of Systems  Constitutional: Negative.   Respiratory: Negative.   Cardiovascular: Negative.     Blood pressure 118/68, pulse 78, resp. rate 14, height 5\' 11"  (1.803 m), weight 158 lb (71.7 kg).  Physical Exam Physical Exam  Constitutional: He is oriented to person, place, and time. He appears well-developed and well-nourished.  Eyes: Conjunctivae are normal. No scleral icterus.  Neck: Neck supple.  Cardiovascular: Normal rate, regular  rhythm and normal heart sounds.  Pulmonary/Chest: Effort normal and breath sounds normal.  Abdominal: Soft. Bowel sounds are normal. No hernia.  Genitourinary:     Lymphadenopathy:    He has no cervical adenopathy.    He has no axillary adenopathy.  Neurological: He is alert and oriented to person, place, and time.  Skin: Skin is warm and dry.  Psychiatric: He has a normal mood and affect.    Data Reviewed ED notes.   Assessment       Actively draining 4cm perianal abscess located 1-2ocl. S/p I&D 01/18/17 in the ED. Taking Bactrim. Pt reports some improvement in symptoms. No signs of systemic infection. Discussed potential of fistula formation in the future. Will re-assess in 3 weeks.   Plan    Complete course of Bactrim. Return for re-assessment in 3 weeks.     HPI, Physical Exam, Assessment and Plan have been scribed under the direction and in the presence of Kathreen CosierS. G. Tarren Velardi, MD  Carron Brazenaryl-Lyn Kennedy, LPN  I have completed the exam and reviewed the above documentation for accuracy and completeness.  I agree with the above.  [employer] has been used and any errors in dictation or transcription are unintentional.  Talajah Slimp G. Evette CristalSankar, M.D., F.A.C.S.  Gerlene BurdockSANKAR,Thana Ramp G 01/25/2017, 9:34 AM

## 2017-02-16 ENCOUNTER — Ambulatory Visit: Payer: PRIVATE HEALTH INSURANCE | Admitting: General Surgery

## 2017-02-18 ENCOUNTER — Ambulatory Visit (INDEPENDENT_AMBULATORY_CARE_PROVIDER_SITE_OTHER): Payer: PRIVATE HEALTH INSURANCE | Admitting: General Surgery

## 2017-02-18 ENCOUNTER — Encounter: Payer: Self-pay | Admitting: General Surgery

## 2017-02-18 VITALS — BP 142/76 | HR 98 | Resp 12 | Ht 71.0 in | Wt 164.0 lb

## 2017-02-18 DIAGNOSIS — K610 Anal abscess: Secondary | ICD-10-CM | POA: Diagnosis not present

## 2017-02-18 MED ORDER — DOXYCYCLINE HYCLATE 100 MG PO CAPS: 100.0000 mg | ORAL_CAPSULE | Freq: Two times a day (BID) | ORAL | 0 refills | Status: DC

## 2017-02-18 NOTE — Patient Instructions (Addendum)
Return in two weeks. The patient is aware to call back for any questions or concerns.  

## 2017-02-18 NOTE — Progress Notes (Signed)
Patient ID: Antonio CharyMichael Dwayne Parks, male   DOB: 29-Oct-1965, 51 y.o.   MRN: [id number]  Chief Complaint  Patient presents with  . Follow-up    HPI Antonio Parks is a 51 y.o. male.  Here for follow up perianal abscess. He states he is doing better but still sore and some drainage. Bleeding with BM is small.  HPI  Past Medical History:  Diagnosis Date  . Asthma     Past Surgical History:  Procedure Laterality Date  . APPENDECTOMY  as child    Family History  Problem Relation Age of Onset  . Skin cancer Mother   . Bone cancer Maternal Aunt     Social History Social History   Tobacco Use  . Smoking status: Never Smoker  . Smokeless tobacco: Current User    Types: Chew  Substance Use Topics  . Alcohol use: Yes    Comment: 4-5 beer per day  . Drug use: No    Allergies  Allergen Reactions  . Penicillins Rash    Current Outpatient Medications  Medication Sig Dispense Refill  . doxycycline (VIBRAMYCIN) 100 MG capsule Take 1 capsule (100 mg total) by mouth 2 (two) times daily. 20 capsule 0   No current facility-administered medications for this visit.     Review of Systems Review of Systems  Constitutional: Negative.   Respiratory: Negative.   Cardiovascular: Negative.     Blood pressure (!) 142/76, pulse 98, resp. rate 12, height 5\' 11"  (1.803 m), weight 164 lb (74.4 kg).  Physical Exam Physical Exam  Constitutional: He appears well-developed and well-nourished.  Genitourinary:     Neurological: He is alert.  Skin: Skin is warm.    Data Reviewed Prior notes reviewed   Assessment    Left anterior perianal abscess. Possibly has a fistula.     Plan   Will Rx with another course of antibiotic. Doxycycline 100mg  bid for 10 days    Return in two weeks.   HPI, Physical Exam, Assessment and Plan have been scribed under the direction and in the presence of Kathreen CosierS. G. Kamylah Manzo, MD Dorathy DaftMarsha Hatch, RN I have completed the exam and reviewed the above  documentation for accuracy and completeness.  I agree with the above.  Museum/gallery conserv[employer] has been used and any errors in dictation or transcription are unintentional.  Angel Hobdy G. Evette CristalSankar, M.D., F.A.C.S.   Gerlene BurdockSANKAR,Seynabou Fults G 02/18/2017, 9:32 AM

## 2017-02-22 ENCOUNTER — Ambulatory Visit (INDEPENDENT_AMBULATORY_CARE_PROVIDER_SITE_OTHER): Payer: PRIVATE HEALTH INSURANCE | Admitting: General Surgery

## 2017-02-22 ENCOUNTER — Encounter: Payer: Self-pay | Admitting: General Surgery

## 2017-02-22 ENCOUNTER — Telehealth: Payer: Self-pay | Admitting: *Deleted

## 2017-02-22 VITALS — BP 128/74 | HR 84 | Resp 14 | Ht 71.0 in | Wt 165.0 lb

## 2017-02-22 DIAGNOSIS — K610 Anal abscess: Secondary | ICD-10-CM | POA: Diagnosis not present

## 2017-02-22 MED ORDER — METRONIDAZOLE 500 MG PO TABS: 500.0000 mg | ORAL_TABLET | Freq: Three times a day (TID) | ORAL | 0 refills | Status: DC

## 2017-02-22 MED ORDER — CIPROFLOXACIN HCL 500 MG PO TABS: 500.0000 mg | ORAL_TABLET | Freq: Two times a day (BID) | ORAL | 0 refills | Status: DC

## 2017-02-22 NOTE — Telephone Encounter (Signed)
Patient called stating that he was seen here for perianal abscess and was started on doxycycline. The patient states that the area is not getting any better, the swelling has not gone down and hurts after bowel movement and is still draining. Patient stated that he thinks it is just getting worse.

## 2017-02-22 NOTE — Patient Instructions (Signed)
Discontinue Doxycycline. Start Cipro twice daily and Flagyl three times daily. Follow up in one week. Use warm sitz baths especially after bowel movements.

## 2017-02-22 NOTE — Progress Notes (Signed)
Patient ID: Antonio Parks, male   DOB: 1965/10/23, 51 y.o.   MRN: [id number]  Chief Complaint  Patient presents with  . Routine Post Op    HPI Antonio Parks is a 51 y.o. male here for follow up from perianal abscess. He is on antibiotics and feels like the are is getting worse. Still having problems with going to the bathroom. He states the drainage is still a lot and gets worse after using the bathroom. HPI  Past Medical History:  Diagnosis Date  . Asthma     Past Surgical History:  Procedure Laterality Date  . APPENDECTOMY  as child    Family History  Problem Relation Age of Onset  . Skin cancer Mother   . Bone cancer Maternal Aunt     Social History Social History   Tobacco Use  . Smoking status: Never Smoker  . Smokeless tobacco: Current User    Types: Chew  Substance Use Topics  . Alcohol use: Yes    Comment: 4-5 beer per day  . Drug use: No    Allergies  Allergen Reactions  . Penicillins Rash    Current Outpatient Medications  Medication Sig Dispense Refill  . doxycycline (VIBRAMYCIN) 100 MG capsule Take 1 capsule (100 mg total) by mouth 2 (two) times daily. 20 capsule 0  . ciprofloxacin (CIPRO) 500 MG tablet Take 1 tablet (500 mg total) by mouth 2 (two) times daily. 20 tablet 0  . metroNIDAZOLE (FLAGYL) 500 MG tablet Take 1 tablet (500 mg total) by mouth 3 (three) times daily. 30 tablet 0   No current facility-administered medications for this visit.     Review of Systems Review of Systems  Constitutional: Negative.   Respiratory: Negative.   Cardiovascular: Negative.     Blood pressure 128/74, pulse 84, resp. rate 14, height 5\' 11"  (1.803 m), weight 165 lb (74.8 kg).  Physical Exam Physical Exam  Constitutional: He is oriented to person, place, and time. He appears well-developMarcelle OvMarcelle [id number] [employer]la [phone] [employer]108mtTolani La Anal abscess and fistula, not responding to Doxycycline    Plan    Discontinue Doxycycline. Start Cipro twice daily and Flagyl three times daily. Follow up in one week.    HPI, Physical Exam, Assessment and Plan have been scribed under the direction and in the presence of S. G. Sankar, MD  Jessica Qualls, CMA  I have completed the exam and reviewed the above documentation for accuracy and completeness.  I agree with the above.  [employer] has been used and any errors in dictation or transcription are unintentional.  Seeplaputhur G. Sankar, M.D., F.A.C.S.  SANKAR,SEEPLAPUTHUR G 02/24/2017, 1:21 PM

## 2017-02-22 NOTE — Telephone Encounter (Signed)
Patient to come into the office to be seen by Dr Evette CristalSankar today.

## 2017-02-27 LAB — ANAEROBIC AND AEROBIC CULTURE

## 2017-03-04 ENCOUNTER — Encounter: Payer: Self-pay | Admitting: General Surgery

## 2017-03-04 ENCOUNTER — Ambulatory Visit (INDEPENDENT_AMBULATORY_CARE_PROVIDER_SITE_OTHER): Payer: PRIVATE HEALTH INSURANCE | Admitting: General Surgery

## 2017-03-04 VITALS — BP 124/70 | HR 80 | Resp 12 | Ht 71.0 in | Wt 166.0 lb

## 2017-03-04 DIAGNOSIS — K610 Anal abscess: Secondary | ICD-10-CM

## 2017-03-04 NOTE — Progress Notes (Signed)
Patient ID: Antonio Parks, male   DOB: [birth year], 51 y.o.   MRN: [id number]  Chief Complaint  Patient presents with  . Follow-up    HPI Antonio CharyMichael Dwayne Osbourne is a 51 y.o. male here today for his follow up perianal abscess. Finishes his antibiotic today.   HPI  Past Medical History:  Diagnosis Date  . Asthma     Past Surgical History:  Procedure Laterality Date  . APPENDECTOMY  as child    Family History  Problem Relation Age of Onset  . Skin cancer Mother   . Bone cancer Maternal Aunt     Social History Social History   Tobacco Use  . Smoking status: Never Smoker  . Smokeless tobacco: Current User    Types: Chew  Substance Use Topics  . Alcohol use: Yes    Comment: 4-5 beer per day  . Drug use: No    Allergies  Allergen Reactions  . Penicillins Rash    Current Outpatient Medications  Medication Sig Dispense Refill  . ciprofloxacin (CIPRO) 500 MG tablet Take 1 tablet (500 mg total) by mouth 2 (two) times daily. 20 tablet 0  . metroNIDAZOLE (FLAGYL) 500 MG tablet Take 1 tablet (500 mg total) by mouth 3 (three) times daily. 30 tablet 0   No current facility-administered medications for this visit.     Review of Systems Review of Systems  Constitutional: Negative.   Respiratory: Negative.   Cardiovascular: Negative.     Blood pressure 124/70, pulse 80, resp. rate 12, height 5\' 11"  (1.803 m), weight 166 lb (75.3 kg).  Physical Exam Physical Exam  Constitutional: He is oriented to person, place, and time. He appears well-developed and well-nourished.  Neurological: He is alert and oriented to person, place, and time.  Skin: Skin is warm and dry.  There is again an area of induration extending from an opening in the perianal skin at the 1 to 2:00 location.  This induration tracks towards the anal canal.  Still has some pus coming through the external opening but the previous redness and induration surrounding the area has resolved  Data Reviewed Prior  notes reviewed  Culture the pus revealed Proteus which was sensitive to Cipro which the patient has completed Assessment    Perianal abscess and anal fistula.  He likely requires surgical management for the fistula to prevent recurrence of the problem.  He also has not had a screening colonoscopy and it may be worthwhile to consider having this done at the same time.    Plan    Return in two weeks with Dr. Consuela MimesBynrett. The patient is aware to call back for any questions or concerns.   HPI, Physical Exam, Assessment and Plan have been scribed under the direction and in the presence of Kathreen CosierS. G. Sankar, MD  Ples SpecterJessica Qualls, CMA     I have completed the exam and reviewed the above documentation for accuracy and completeness.  I agree with the above.  [employer] has been used and any errors in dictation or transcription are unintentional.  Seeplaputhur G. Evette CristalSankar, M.D., F.A.C.S.    Gerlene BurdockSANKAR,SEEPLAPUTHUR G 03/04/2017, 3:16 PM

## 2017-03-04 NOTE — Patient Instructions (Signed)
Return in two weeks with Dr. Evette CristalSankar.

## 2017-03-17 ENCOUNTER — Encounter: Payer: Self-pay | Admitting: General Surgery

## 2017-03-17 ENCOUNTER — Ambulatory Visit (INDEPENDENT_AMBULATORY_CARE_PROVIDER_SITE_OTHER): Payer: PRIVATE HEALTH INSURANCE | Admitting: General Surgery

## 2017-03-17 VITALS — BP 152/88 | HR 98 | Resp 14 | Ht 71.0 in | Wt 167.0 lb

## 2017-03-17 DIAGNOSIS — K603 Anal fistula: Secondary | ICD-10-CM | POA: Diagnosis not present

## 2017-03-17 MED ORDER — CIPROFLOXACIN HCL 500 MG PO TABS: 500.0000 mg | ORAL_TABLET | Freq: Two times a day (BID) | ORAL | 0 refills | Status: AC

## 2017-03-17 NOTE — Progress Notes (Signed)
Patient ID: Antonio Parks, male   DOB: 1965-06-22, 52 y.o.   MRN: [id number]  Chief Complaint  Patient presents with  . Follow-up    HPI Antonio Parks is a 52 y.o. male.  Here for follow up perianal abscess and anal fistula. He states he was doing until 2 days ago. He states it is "throbbing" some after a BM and the drainage is darker.  He thinks the "core is coming out".He is still doing warm soaks. No further diarrhea. 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  HPI  Past Medical History:  Diagnosis Date  . Asthma     Past Surgical History:  Procedure Laterality Date  . APPENDECTOMY  as child    Family History  Problem Relation Age of Onset  . Skin cancer Mother   . Bone cancer Maternal Aunt     Social History Social History   Tobacco Use  . Smoking status: Never Smoker  . Smokeless tobacco: Current User    Types: Chew  Substance Use Topics  . Alcohol use: Yes    Comment: 4-5 beer per day  . Drug use: No    Allergies  Allergen Reactions  . Penicillins Rash    Current Outpatient Medications  Medication Sig Dispense Refill  . acetaminophen (TYLENOL) 325 MG tablet Take 650 mg by mouth every 6 (six) hours as needed.    Marland Kitchen ibuprofen (ADVIL,MOTRIN) 200 MG tablet Take 200 mg by mouth every 6 (six) hours as needed.    . ciprofloxacin (CIPRO) 500 MG tablet Take 1 tablet (500 mg total) by mouth 2 (two) times daily for 10 days. 20 tablet 0   No current facility-administered medications for this visit.     Review of Systems Review of Systems  Constitutional: Negative.   Respiratory: Negative.   Cardiovascular: Negative.     Blood pressure (!) 152/88, pulse 98, resp. rate 14, height 5\' 11"  (1.803 m), weight 167 lb (75.8 kg), SpO2 98 %.  Physical Exam Physical Exam  Constitutional: He is oriented to person, place, and time. He appears well-developed and well-nourished.  HENT:  Mouth/Throat: Oropharynx is clear and moist.  Eyes: Conjunctivae are normal. No  scleral icterus.  Neck: Neck supple.  Cardiovascular: Normal rate, regular rhythm and normal heart sounds.  Pulmonary/Chest: Effort normal and breath sounds normal.  Genitourinary:     Genitourinary Comments: External opening at 1 o'clock  4 cm from anus  Lymphadenopathy:    He has no cervical adenopathy.  Neurological: He is alert and oriented to person, place, and time.  Skin: Skin is warm and dry.  Psychiatric: His behavior is normal.    Data Reviewed :  Final result Visible to patient:  No (Not Released) Next appt:  None   Ref Range & Units 4mo ago (12/24/16) 8yr ago (06/09/13) 11yr ago (06/07/13)  WBC 3.8 - 10.6 K/uL 17.4 Abnormally high   13.2 Abnormally high  R 12.6 Abnormally high  R  RBC 4.40 - 5.90 MIL/uL 4.02 Abnormally low    4.19 Abnormally low  R  Hemoglobin 13.0 - 18.0 g/dL 96.2 Abnormally low      HCT 40.0 - 52.0 % 37.5 Abnormally low      MCV 80.0 - 100.0 fL 93.4   95 R  MCH 26.0 - 34.0 pg 31.8   31.8   MCHC 32.0 - 36.0 g/dL 95.2   84.1   RDW 32.4 - 14.5 % 12.8   13.1   Platelets 150 - 440  K/uL 472 Abnormally high    165 R        Ref Range & Units 17mo ago (12/24/16) 525yr ago (06/09/13) 665yr ago (06/07/13)  Sodium 135 - 145 mmol/L 129 Abnormally low    138 R  Potassium 3.5 - 5.1 mmol/L 3.2 Abnormally low    3.8   Chloride 101 - 111 mmol/L 92 Abnormally low    106 R  CO2 22 - 32 mmol/L 27   26 R  Glucose, Bld 65 - 99 mg/dL 161118 Abnormally high    120 Abnormally high    BUN 6 - 20 mg/dL 9   10 R  Creatinine, Ser 0.61 - 1.24 mg/dL 0.961.06  0.451.11 R 4.091.21 R  Calcium 8.9 - 10.3 mg/dL 8.2 Abnormally low    7.9 Abnormally low  R  Total Protein 6.5 - 8.1 g/dL 7.1   6.6 R  Albumin 3.5 - 5.0 g/dL 2.9 Abnormally low    3.5 R  AST 15 - 41 U/L 20   49 Abnormally high  R  ALT 17 - 63 U/L 24   48 R  Alkaline Phosphatase 38 - 126 U/L 85   76 R, CM  Total Bilirubin 0.3 - 1.2 mg/dL 0.8   0.6 R  GFR calc non Af Amer >60 mL/min >60     GFR calc Af Amer >60 mL/min >60         Assessment    Fistula in ano.  Low serum potassium in October.  Past history of diarrhea, unclear source.  Candidate for colonoscopy after present perianal inflammatory process resolves.  History of alcohol use.     Plan    The patient shows mild inflammatory changes in the perineum consistent with ongoing infection.  He has been on Bactrim and doxycycline in the past.  With his penicillin allergy, will make use of Cipro.  Considering his alcohol use will hold Flagyl at this time.  Risks of fistulotomy including bleeding incontinence and infection reviewed.    Cipro 500 mg 1 po BID #20 Plan Fistulotomy  HPI, Physical Exam, Assessment and Plan have been scribed under the direction and in the presence of Earline MayotteJeffrey W. Conita Amenta, MD. Dorathy DaftMarsha Hatch, RN   Earline MayotteByrnett, Capucine Tryon W 03/17/2017, 2:11 PM  [facility]   Nicholes MangoMichele J. Bailey, CMA

## 2017-03-17 NOTE — Patient Instructions (Addendum)
The patient is aware to call back for any questions or concerns. Plan Fistulotomy Anal Fistula An anal fistula is an abnormal tunnel that develops between the bowel and the skin near the outside of the anus, where stool (feces) comes out. The anus has many tiny glands that make lubricating fluid. Sometimes, these glands become plugged and infected, and that can cause a fluid-filled pocket (abscess) to form. An anal fistula often develops after this infection or abscess. What are the causes? In most cases, an anal fistula is caused by a past or current anal abscess. Other causes include:  A complication of surgery.  Trauma to the rectal area.  Radiation to the area.  Medical conditions or diseases, such as: ? Chronic inflammatory bowel disease, such as Crohn disease or ulcerative colitis. ? Colon cancer or rectal cancer. ? Diverticular disease, such as diverticulitis. ? An STD (sexually transmitted disease), such as gonorrhea, chlamydia, or syphilis. ? An infection that is caused by HIV (human immunodeficiency virus). ? Foreign body in the rectum.  What are the signs or symptoms? Symptoms of this condition include:  Throbbing or constant pain that may be worse while you are sitting.  Swelling or irritation around the anus.  Drainage of pus or blood from an opening near the anus.  Pain with bowel movements.  Fever or chills.  How is this diagnosed? Your health care provider will examine the area to find the openings of the anal fistula and the fistula tract. The external opening of the anal fistula may be seen during a physical exam. You may also have tests, including:  An exam of the rectal area with a gloved hand (digital rectal exam).  An exam with a probe or scope to help locate the internal opening of the fistula.  Imaging tests to find the exact location and path of the fistula. These tests may include X-rays, an ultrasound, a CT scan, or MRI. The path is made visible  by a dye that is injected into the fistula opening.  You may have other tests to find the cause of the anal fistula. How is this treated? The most common treatment for an anal fistula is surgery. The type of surgery that is used will depend on where the fistula is located and how complex the fistula is. Surgical options include:  A fistulotomy. The whole fistula is opened up, and the contents are drained to promote healing.  Seton placement. A silk string (seton) is placed into the fistula during a fistulotomy. This helps to drain any infection to promote healing.  Advancement flap procedure. Tissue is removed from your rectum or the skin around the anus and is attached to the opening of the fistula.  Bioprosthetic plug. A cone-shaped plug is made from your tissue and is used to block the opening of the fistula.  Some anal fistulas do not require surgery. A nonsurgical treatment option involves injecting a fibrin glue to seal the fistula. You also may be prescribed an antibiotic medicine to treat an infection. Follow these instructions at home: Medicines  Take over-the-counter and prescription medicines only as told by your health care provider.  If you were prescribed an antibiotic medicine, take it as told by your health care provider. Do not stop taking the antibiotic even if you start to feel better.  Use a stool softener or a laxative if told to do so by your health care provider. General instructions  Eat a high-fiber diet as told by your health care  provider. This can help to prevent constipation.  Drink enough fluid to keep your urine clear or pale yellow.  Take a warm sitz bath for 15-20 minutes, 3-4 times per day, or as told by your health care provider. Sitz baths can ease your pain and discomfort and help with healing.  Follow good hygiene to keep the anal area as clean and dry as possible. Use wet toilet paper or a moist towelette after each bowel movement.  Keep all  follow-up visits as told by your health care provider. This is important. Contact a health care provider if:  You have increased pain that is not controlled with medicines.  You have new redness or swelling around the anal area.  You have new fluid, blood, or pus coming from the anal area.  You have tenderness or warmth around the anal area. Get help right away if:  You have a fever.  You have severe pain.  You have chills or diarrhea.  You have severe problems urinating or having a bowel movement. This information is not intended to replace advice given to you by your health care provider. Make sure you discuss any questions you have with your health care provider. Document Released: 02/06/2008 Document Revised: 08/01/2015 Document Reviewed: 05/21/2014 Elsevier Interactive Patient Education  [employer]2018 Elsevier Inc.

## 2017-03-19 ENCOUNTER — Other Ambulatory Visit: Payer: Self-pay

## 2017-03-19 ENCOUNTER — Encounter
Admission: RE | Admit: 2017-03-19 | Discharge: 2017-03-19 | Disposition: A | Discharge status: Home / Self Care | Payer: PRIVATE HEALTH INSURANCE | Source: Ambulatory Visit | Attending: General Surgery | Admitting: General Surgery

## 2017-03-19 HISTORY — DX: Gastro-esophageal reflux disease without esophagitis: K21.9

## 2017-03-19 HISTORY — DX: Elevated blood-pressure reading, without diagnosis of hypertension: R03.0

## 2017-03-19 NOTE — Patient Instructions (Signed)
  Your procedure is scheduled on: 03-22-17 Report to Same Day Surgery 2nd floor medical mall [facility](Medical Mall Entrance-take elevator on left to 2nd floor.  Check in with surgery information desk.) @ 0915 AM PER PT   Remember: Instructions that are not followed completely may result in serious medical risk, up to and including death, or upon the discretion of your surgeon and anesthesiologist your surgery may need to be rescheduled.    _x___ 1. Do not eat food after midnight the night before your procedure. NO GUM OR CANDY AFTER MIDNIGHT.  You may drink clear liquids up to 2 hours before you are scheduled to arrive at the hospital for your procedure.  Do not drink clear liquids within 2 hours of your scheduled arrival to the hospital.  Clear liquids include  --Water or Apple juice without pulp  --Clear carbohydrate beverage such as ClearFast or Gatorade  --Black Coffee or Clear Tea (No milk, no creamers, do not add anything to the coffee or Tea      __x__ 2. No Alcohol for 24 hours before or after surgery.   __x__3. No Smoking for 24 prior to surgery-DO NOT DIP 6 HOURS PRIOR TO SURGERY   ____  4. Bring all medications with you on the day of surgery if instructed.    __x__ 5. Notify your doctor if there is any change in your medical condition     (cold, fever, infections).     Do not wear jewelry, make-up, hairpins, clips or nail polish.  Do not wear lotions, powders, or perfumes. You may wear deodorant.  Do not shave 48 hours prior to surgery. Men may shave face and neck.  Do not bring valuables to the hospital.    [facility] is not responsible for any belongings or valuables.               Contacts, dentures or bridgework may not be worn into surgery.  Leave your suitcase in the car. After surgery it may be brought to your room.  For patients admitted to the hospital, discharge time is determined by your treatment team.   Patients discharged the day of surgery will not be allowed to  drive home.  You will need someone to drive you home and stay with you the night of your procedure.     ____ Take anti-hypertensive listed below, cardiac, seizure, asthma,  anti-reflux and psychiatric medicines. These include:  1. NONE  2.  3.  4.  5.  6.  ____Fleets enema or Magnesium Citrate as directed.   ____ Use CHG Soap or sage wipes as directed on instruction sheet   ____ Use inhalers on the day of surgery and bring to hospital day of surgery  ____ Stop Metformin and Janumet 2 days prior to surgery.    ____ Take 1/2 of usual insulin dose the night before surgery and none on the morning  surgery.   ____ Follow recommendations from Cardiologist, Pulmonologist or PCP regarding  stopping Aspirin, Coumadin, Plavix ,Eliquis, Effient, or Pradaxa, and Pletal.  ____Stop Anti-inflammatories such as Advil, Aleve, Ibuprofen, Motrin, Naproxen, Naprosyn, Goodies powders or aspirin products NOW-OK to take Tylenol    ____ Stop supplements until after surgery   ____ Bring C-Pap to the hospital.

## 2017-03-21 MED ORDER — SODIUM CHLORIDE 0.9 % IV SOLN
1.0000 g | INTRAVENOUS | Status: AC
  Administered 2017-03-22: 1 g via INTRAVENOUS
  Filled 2017-03-21: qty 1

## 2017-03-22 ENCOUNTER — Ambulatory Visit
Admission: RE | Admit: 2017-03-22 | Discharge: 2017-03-22 | Disposition: A | Discharge status: Home / Self Care | Payer: PRIVATE HEALTH INSURANCE | Source: Ambulatory Visit | Attending: General Surgery | Admitting: General Surgery

## 2017-03-22 ENCOUNTER — Encounter
Admission: RE | Disposition: A | Discharge status: Home / Self Care | Payer: Self-pay | Source: Ambulatory Visit | Attending: General Surgery

## 2017-03-22 ENCOUNTER — Encounter: Payer: Self-pay | Admitting: *Deleted

## 2017-03-22 ENCOUNTER — Ambulatory Visit: Payer: PRIVATE HEALTH INSURANCE | Admitting: Certified Registered Nurse Anesthetist

## 2017-03-22 ENCOUNTER — Other Ambulatory Visit: Payer: Self-pay

## 2017-03-22 DIAGNOSIS — F17220 Nicotine dependence, chewing tobacco, uncomplicated: Secondary | ICD-10-CM | POA: Insufficient documentation

## 2017-03-22 DIAGNOSIS — K219 Gastro-esophageal reflux disease without esophagitis: Secondary | ICD-10-CM | POA: Insufficient documentation

## 2017-03-22 DIAGNOSIS — K603 Anal fistula: Secondary | ICD-10-CM

## 2017-03-22 DIAGNOSIS — Z79899 Other long term (current) drug therapy: Secondary | ICD-10-CM | POA: Insufficient documentation

## 2017-03-22 DIAGNOSIS — K610 Anal abscess: Secondary | ICD-10-CM | POA: Insufficient documentation

## 2017-03-22 HISTORY — PX: FISTULOTOMY: SHX6413

## 2017-03-22 LAB — BASIC METABOLIC PANEL
ANION GAP: 10 (ref 5–15)
BUN: 11 mg/dL (ref 6–20)
CHLORIDE: 103 mmol/L (ref 101–111)
CO2: 25 mmol/L (ref 22–32)
Calcium: 9 mg/dL (ref 8.9–10.3)
Creatinine, Ser: 1.08 mg/dL (ref 0.61–1.24)
GFR calc non Af Amer: >60 mL/min (ref >60)
Glucose, Bld: 120 mg/dL — ABNORMAL HIGH (ref 65–99)
POTASSIUM: 3.8 mmol/L (ref 3.5–5.1)
Sodium: 138 mmol/L (ref 135–145)

## 2017-03-22 LAB — CBC WITH DIFFERENTIAL/PLATELET
Basophils Absolute: 0 10*3/uL (ref 0–0.1)
Basophils Relative: 0 %
Eosinophils Absolute: 0.2 10*3/uL (ref 0–0.7)
Eosinophils Relative: 2 %
HEMATOCRIT: 44.9 % (ref 40.0–52.0)
HEMOGLOBIN: 15.1 g/dL (ref 13.0–18.0)
LYMPHS ABS: 1.2 10*3/uL (ref 1.0–3.6)
Lymphocytes Relative: 17 %
MCH: 32 pg (ref 26.0–34.0)
MCHC: 33.5 g/dL (ref 32.0–36.0)
MCV: 95.4 fL (ref 80.0–100.0)
MONOS PCT: 9 %
Monocytes Absolute: 0.6 10*3/uL (ref 0.2–1.0)
NEUTROS ABS: 4.9 10*3/uL (ref 1.4–6.5)
NEUTROS PCT: 72 %
Platelets: 176 10*3/uL (ref 150–440)
RBC: 4.71 MIL/uL (ref 4.40–5.90)
RDW: 13.1 % (ref 11.5–14.5)
WBC: 6.9 10*3/uL (ref 3.8–10.6)

## 2017-03-22 SURGERY — FISTULOTOMY
Anesthesia: General | Wound class: Contaminated

## 2017-03-22 MED ORDER — MIDAZOLAM HCL 2 MG/2ML IJ SOLN
INTRAMUSCULAR | Status: AC
  Filled 2017-03-22: qty 2

## 2017-03-22 MED ORDER — FENTANYL CITRATE (PF) 100 MCG/2ML IJ SOLN
25.0000 ug | INTRAMUSCULAR | Status: DC | PRN
  Administered 2017-03-22: 25 ug via INTRAVENOUS
  Administered 2017-03-22: 50 ug via INTRAVENOUS
  Administered 2017-03-22: 25 ug via INTRAVENOUS

## 2017-03-22 MED ORDER — BUPIVACAINE LIPOSOME 1.3 % IJ SUSP
INTRAMUSCULAR | Status: AC
  Filled 2017-03-22: qty 20

## 2017-03-22 MED ORDER — PROMETHAZINE HCL 25 MG/ML IJ SOLN: 6.2500 mg | INTRAMUSCULAR | Status: DC | PRN

## 2017-03-22 MED ORDER — SITZ BATH MISC: 1.0000 [IU] | Freq: Four times a day (QID) | 0 refills | Status: DC | PRN

## 2017-03-22 MED ORDER — LIDOCAINE HCL (CARDIAC) 20 MG/ML IV SOLN
INTRAVENOUS | Status: DC | PRN
  Administered 2017-03-22: 100 mg via INTRAVENOUS

## 2017-03-22 MED ORDER — BUPIVACAINE LIPOSOME 1.3 % IJ SUSP
INTRAMUSCULAR | Status: DC | PRN
  Administered 2017-03-22: 20 mL

## 2017-03-22 MED ORDER — FENTANYL CITRATE (PF) 100 MCG/2ML IJ SOLN
INTRAMUSCULAR | Status: DC | PRN
  Administered 2017-03-22: 50 ug via INTRAVENOUS
  Administered 2017-03-22 (×2): 25 ug via INTRAVENOUS

## 2017-03-22 MED ORDER — MIDAZOLAM HCL 2 MG/2ML IJ SOLN
INTRAMUSCULAR | Status: DC | PRN
  Administered 2017-03-22: 2 mg via INTRAVENOUS

## 2017-03-22 MED ORDER — MEPERIDINE HCL 50 MG/ML IJ SOLN: 6.2500 mg | INTRAMUSCULAR | Status: DC | PRN

## 2017-03-22 MED ORDER — BUPIVACAINE HCL (PF) 0.25 % IJ SOLN
INTRAMUSCULAR | Status: DC | PRN
  Administered 2017-03-22: 30 mL

## 2017-03-22 MED ORDER — GABAPENTIN 300 MG PO CAPS
300.0000 mg | ORAL_CAPSULE | ORAL | Status: AC
  Administered 2017-03-22: 300 mg via ORAL

## 2017-03-22 MED ORDER — HYDROCODONE-ACETAMINOPHEN 5-325 MG PO TABS: 1.0000 | ORAL_TABLET | ORAL | 0 refills | Status: DC | PRN

## 2017-03-22 MED ORDER — LACTATED RINGERS IV SOLN
INTRAVENOUS | Status: DC
  Administered 2017-03-22 (×2): via INTRAVENOUS

## 2017-03-22 MED ORDER — FENTANYL CITRATE (PF) 100 MCG/2ML IJ SOLN
INTRAMUSCULAR | Status: AC
  Filled 2017-03-22: qty 2

## 2017-03-22 MED ORDER — FAMOTIDINE 20 MG PO TABS
ORAL_TABLET | ORAL | Status: AC
  Administered 2017-03-22: 20 mg via ORAL
  Filled 2017-03-22: qty 1

## 2017-03-22 MED ORDER — CELECOXIB 200 MG PO CAPS
200.0000 mg | ORAL_CAPSULE | ORAL | Status: AC
  Administered 2017-03-22: 200 mg via ORAL

## 2017-03-22 MED ORDER — FAMOTIDINE 20 MG PO TABS
20.0000 mg | ORAL_TABLET | Freq: Once | ORAL | Status: AC
  Administered 2017-03-22: 20 mg via ORAL

## 2017-03-22 MED ORDER — ONDANSETRON HCL 4 MG/2ML IJ SOLN
INTRAMUSCULAR | Status: DC | PRN
  Administered 2017-03-22: 4 mg via INTRAVENOUS

## 2017-03-22 MED ORDER — GABAPENTIN 300 MG PO CAPS
ORAL_CAPSULE | ORAL | Status: AC
  Administered 2017-03-22: 300 mg via ORAL
  Filled 2017-03-22: qty 1

## 2017-03-22 MED ORDER — OXYCODONE HCL 5 MG PO TABS: 5.0000 mg | ORAL_TABLET | Freq: Once | ORAL | Status: DC | PRN

## 2017-03-22 MED ORDER — PROPOFOL 10 MG/ML IV BOLUS
INTRAVENOUS | Status: DC | PRN
  Administered 2017-03-22: 180 mg via INTRAVENOUS

## 2017-03-22 MED ORDER — FENTANYL CITRATE (PF) 100 MCG/2ML IJ SOLN
INTRAMUSCULAR | Status: AC
  Administered 2017-03-22: 25 ug via INTRAVENOUS
  Filled 2017-03-22: qty 2

## 2017-03-22 MED ORDER — BUPIVACAINE HCL (PF) 0.25 % IJ SOLN
INTRAMUSCULAR | Status: AC
  Filled 2017-03-22: qty 30

## 2017-03-22 MED ORDER — CELECOXIB 200 MG PO CAPS
ORAL_CAPSULE | ORAL | Status: AC
  Administered 2017-03-22: 200 mg via ORAL
  Filled 2017-03-22: qty 1

## 2017-03-22 MED ORDER — OXYCODONE HCL 5 MG/5ML PO SOLN: 5.0000 mg | Freq: Once | ORAL | Status: DC | PRN

## 2017-03-22 SURGICAL SUPPLY — 28 items
BLADE SURG 15 STRL SS SAFETY (BLADE) ×3 IMPLANT
BRIEF STRETCH MATERNITY 2XLG (MISCELLANEOUS) ×3 IMPLANT
CANISTER SUCT 1200ML W/VALVE (MISCELLANEOUS) ×3 IMPLANT
DRAPE LAPAROTOMY 100X77 ABD (DRAPES) ×3 IMPLANT
DRAPE LEGGINS SURG 28X43 STRL (DRAPES) ×3 IMPLANT
DRAPE UNDER BUTTOCK W/FLU (DRAPES) ×3 IMPLANT
DRSG GAUZE PETRO 6X36 STRIP ST (GAUZE/BANDAGES/DRESSINGS) ×3 IMPLANT
ELECT REM PT RETURN 9FT ADLT (ELECTROSURGICAL) ×3
ELECTRODE REM PT RTRN 9FT ADLT (ELECTROSURGICAL) ×1 IMPLANT
GLOVE BIO SURGEON STRL SZ7.5 (GLOVE) ×3 IMPLANT
GLOVE INDICATOR 8.0 STRL GRN (GLOVE) ×3 IMPLANT
GOWN STRL REUS W/ TWL LRG LVL3 (GOWN DISPOSABLE) ×2 IMPLANT
GOWN STRL REUS W/TWL LRG LVL3 (GOWN DISPOSABLE) ×4
KIT RM TURNOVER STRD PROC AR (KITS) ×3 IMPLANT
LABEL OR SOLS (LABEL) ×3 IMPLANT
NEEDLE HYPO 22GX1.5 SAFETY (NEEDLE) ×3 IMPLANT
NEEDLE HYPO 25X1 1.5 SAFETY (NEEDLE) ×3 IMPLANT
NS IRRIG 500ML POUR BTL (IV SOLUTION) ×3 IMPLANT
PACK BASIN MINOR ARMC (MISCELLANEOUS) ×3 IMPLANT
PAD OB MATERNITY 4.3X12.25 (PERSONAL CARE ITEMS) ×3 IMPLANT
PAD PREP 24X41 OB/GYN DISP (PERSONAL CARE ITEMS) ×3 IMPLANT
SOL PREP PVP 2OZ (MISCELLANEOUS) ×3
SOLUTION PREP PVP 2OZ (MISCELLANEOUS) ×1 IMPLANT
SURGILUBE 2OZ TUBE FLIPTOP (MISCELLANEOUS) ×3 IMPLANT
SUT SILK 0 CT 1 30 (SUTURE) ×3 IMPLANT
SUT VIC AB 3-0 SH 27 (SUTURE) ×2
SUT VIC AB 3-0 SH 27X BRD (SUTURE) ×1 IMPLANT
SYR CONTROL 10ML (SYRINGE) ×3 IMPLANT

## 2017-03-22 NOTE — Transfer of Care (Signed)
Immediate Anesthesia Transfer of Care Note  Patient: Antonio Parks  Procedure(s) Performed: FISTULOTOMY (N/A )  Patient Location: PACU  Anesthesia Type:MAC  Level of Consciousness: awake, alert , oriented, patient cooperative and responds to stimulation  Airway & Oxygen Therapy: Patient Spontanous Breathing and Patient connected to nasal cannula oxygen  Post-op Assessment: Report given to RN and Post -op Vital signs reviewed and stable  Post vital signs: stable  Last Vitals:  Vitals:   03/22/17 0927 03/22/17 1111  BP: (!) 159/101   Pulse: 95   Resp: 16   Temp: 36.8 C (!) (P) 35.8 C  SpO2: 100%     Last Pain:  Vitals:   03/22/17 0927  TempSrc: Oral         Complications: No apparent anesthesia complications

## 2017-03-22 NOTE — H&P (Signed)
No change in clinical history or exam.  

## 2017-03-22 NOTE — Anesthesia Post-op Follow-up Note (Signed)
Anesthesia QCDR form completed.        

## 2017-03-22 NOTE — Op Note (Signed)
Preoperative diagnosis: Fistula with abscess.  Postoperative diagnosis: Same.  Operative procedure: Exam under anesthesia, fistulotomy.  Operating Surgeon: Donnalee CurryJeffrey Emmet Messer, MD.  Anesthesia: General by LMA, Marcaine 0.25%, plain, 30 cc; Exparel: 20 cc.  Estimated blood loss: 20 cc.  Clinical note: This 52 year old male has a perirectal abscess thought secondary to a fistula.  He was admitted for elective unroofing.  He received Invanz prior to the procedure.  Operative note: With the patient under adequate general anesthesia was placed in dorsal lithotomy position and the perineum cleansed with Betadine solution and draped.  The fistula opening on the left side towards the base of the scrotum was cannulated and this passed through the superficial on the first layer of the external sphincter to the dentate line.  This was incised with a knife and then opened with electrocautery.  The base was curetted.  Digital exam showed an intact sphincter.  No rectal masses noted.  The tract extended anteriorly about a centimeter and a half.  This was opened and debrided.  Hemostasis was electrocautery.  Vaseline gauze pack placed in the rectum.  Dry dressing applied.  The patient tolerated the procedure well and was taken to recovery room in stable condition.

## 2017-03-22 NOTE — Anesthesia Preprocedure Evaluation (Signed)
Anesthesia Evaluation  Patient identified by MRN, date of birth, ID band Patient awake    Reviewed: Allergy & Precautions, NPO status , Patient's Chart, lab work & pertinent test results  History of Anesthesia Complications Negative for: history of anesthetic complications  Airway Mallampati: II  TM Distance: <3 FB Neck ROM: Full    Dental no notable dental hx.    Pulmonary asthma , neg sleep apnea,    breath sounds clear to auscultation- rhonchi (-) wheezing      Cardiovascular Exercise Tolerance: Good (-) hypertension(-) CAD, (-) Past MI, (-) Cardiac Stents and (-) CABG  Rhythm:Regular Rate:Normal - Systolic murmurs and - Diastolic murmurs    Neuro/Psych negative neurological ROS  negative psych ROS   GI/Hepatic Neg liver ROS, GERD  Controlled,  Endo/Other  negative endocrine ROSneg diabetes  Renal/GU negative Renal ROS     Musculoskeletal negative musculoskeletal ROS (+)   Abdominal (+) - obese,   Peds  Hematology negative hematology ROS (+)   Anesthesia Other Findings Past Medical History: No date: Asthma     Comment:  AS A CHILD-NO INHALERS No date: Elevated blood pressure reading     Comment:  NO MEDS No date: GERD (gastroesophageal reflux disease)     Comment:  TUMS OCC   Reproductive/Obstetrics                             Anesthesia Physical Anesthesia Plan  ASA: II  Anesthesia Plan: General   Post-op Pain Management:    Induction: Intravenous  PONV Risk Score and Plan: 1  Airway Management Planned: LMA  Additional Equipment:   Intra-op Plan:   Post-operative Plan:   Informed Consent: I have reviewed the patients History and Physical, chart, labs and discussed the procedure including the risks, benefits and alternatives for the proposed anesthesia with the patient or authorized representative who has indicated his/her understanding and acceptance.   Dental  advisory given  Plan Discussed with: CRNA and Anesthesiologist  Anesthesia Plan Comments:         Anesthesia Quick Evaluation

## 2017-03-22 NOTE — Progress Notes (Signed)
Dr. Brynett into see 

## 2017-03-22 NOTE — Anesthesia Postprocedure Evaluation (Signed)
Anesthesia Post Note  Patient: Antonio Parks  Procedure(s) Performed: FISTULOTOMY (N/A )  Patient location during evaluation: PACU Anesthesia Type: General Level of consciousness: awake and alert and oriented Pain management: pain level controlled Vital Signs Assessment: post-procedure vital signs reviewed and stable Respiratory status: spontaneous breathing, nonlabored ventilation and respiratory function stable Cardiovascular status: blood pressure returned to baseline and stable Postop Assessment: no signs of nausea or vomiting Anesthetic complications: no     Last Vitals:  Vitals:   03/22/17 1124 03/22/17 1146  BP:    Pulse: 78 77  Resp: 20 14  Temp:    SpO2: 99% 96%    Last Pain:  Vitals:   03/22/17 1146  TempSrc:   PainSc: 4                  Finlay Godbee

## 2017-03-22 NOTE — Anesthesia Procedure Notes (Signed)
Procedure Name: LMA Insertion Date/Time: 03/22/2017 10:36 AM Performed by: Hermenia BersNewman, Javeion Cannedy W, CRNA Pre-anesthesia Checklist: Patient identified, Emergency Drugs available, Suction available, Patient being monitored and Timeout performed Patient Re-evaluated:Patient Re-evaluated prior to induction Oxygen Delivery Method: Circle system utilized Preoxygenation: Pre-oxygenation with 100% oxygen Induction Type: IV induction Ventilation: Mask ventilation without difficulty LMA: LMA inserted LMA Size: 4.0 Number of attempts: 1 Placement Confirmation: positive ETCO2,  breath sounds checked- equal and bilateral and CO2 detector Tube secured with: Tape Dental Injury: Teeth and Oropharynx as per pre-operative assessment

## 2017-03-23 ENCOUNTER — Encounter: Payer: Self-pay | Admitting: General Surgery

## 2017-03-30 ENCOUNTER — Encounter: Payer: Self-pay | Admitting: General Surgery

## 2017-03-30 ENCOUNTER — Ambulatory Visit (INDEPENDENT_AMBULATORY_CARE_PROVIDER_SITE_OTHER): Payer: PRIVATE HEALTH INSURANCE | Admitting: General Surgery

## 2017-03-30 VITALS — BP 160/102 | HR 110 | Resp 16 | Ht 71.0 in | Wt 171.0 lb

## 2017-03-30 DIAGNOSIS — K603 Anal fistula: Secondary | ICD-10-CM

## 2017-03-30 NOTE — Patient Instructions (Addendum)
The patient is aware to call back for any questions or concerns. Recommend 3 Ibuprofen three times day.

## 2017-03-30 NOTE — Progress Notes (Signed)
Patient ID: Antonio Parks, male   DOB: Feb 10, 1966, 52 y.o.   MRN: [id number]  Chief Complaint  Patient presents with  . Routine Post Op    HPI Antonio Parks is a 52 y.o. male here today for his post op fistulectomy done on 03/22/2017. He states he is better, drainage is less. Pain and mild bleeding with BM. The patient reports no difficulty with controlling his stools.  He is still having a significant amount of drainage.  HPI  Past Medical History:  Diagnosis Date  . Asthma    AS A CHILD-NO INHALERS  . Elevated blood pressure reading    NO MEDS  . GERD (gastroesophageal reflux disease)    TUMS OCC    Past Surgical History:  Procedure Laterality Date  . APPENDECTOMY  as child  . FISTULOTOMY N/A 03/22/2017   Procedure: FISTULOTOMY;  Surgeon: Earline Mayotte, MD;  Location: [facility] ORS;  Service: General;  Laterality: N/A;    Family History  Problem Relation Age of Onset  . Skin cancer Mother   . Bone cancer Maternal Aunt     Social History Social History   Tobacco Use  . Smoking status: Never Smoker  . Smokeless tobacco: Current User    Types: Chew  Substance Use Topics  . Alcohol use: Yes    Comment: 4-5 beer per day  . Drug use: No    Allergies  Allergen Reactions  . Penicillins Rash    Has patient had a PCN reaction causing immediate rash, facial/tongue/throat swelling, SOB or lightheadedness with hypotension: Yes Has patient had a PCN reaction causing severe rash involving mucus membranes or skin necrosis: No Has patient had a PCN reaction that required hospitalization: No Has patient had a PCN reaction occurring within the last 10 years: No If all of the above answers are "NO", then may proceed with Cephalosporin use.     Current Outpatient Medications  Medication Sig Dispense Refill  . acetaminophen (TYLENOL) 500 MG tablet Take 1,000 mg by mouth 2 (two) times daily as needed for moderate pain or headache.    . ibuprofen (ADVIL,MOTRIN) 200 MG  tablet Take 600-800 mg by mouth daily as needed for moderate pain.     . Misc. Devices (SITZ BATH) MISC Place 1 Units into the perineum 4 (four) times daily as needed. 1 each 0   No current facility-administered medications for this visit.     Review of Systems Review of Systems  Constitutional: Negative.   Respiratory: Negative.   Cardiovascular: Negative.     Blood pressure (!) 160/102, pulse (!) 110, resp. rate 16, height 5\' 11"  (1.803 m), weight 171 lb (77.6 kg).  Physical Exam Physical Exam  Constitutional: He is oriented to person, place, and time. He appears well-developed and well-nourished.  Genitourinary:     Genitourinary Comments: Area healing  Neurological: He is alert and oriented to person, place, and time.  Skin: Skin is warm and dry.  Psychiatric: His behavior is normal.       Assessment    Doing well post fistulotomy.    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  Right now he still uncomfortable.  He required no narcotics postop, but has been infrequently making use of anti-inflammatories.  I think it will be more comfortable with a regular dose of Motrin 600 mg 3 times daily.  Preoperative comprehensive metabolic panel showed normal renal function.  He states he stated a blood pressure pill last week , doesn't know the name. Follow up in 2 weeks.    HPI, Physical Exam, Assessment and Plan have been scribed under the direction and in the presence of Earline MayotteJeffrey W. Wrenley Sayed, MD. Dorathy DaftMarsha Hatch, RN  I have completed the exam and reviewed the above documentation for accuracy and completeness.  I agree with the above.  Museum/gallery conserv[employer]agon [employer] has been used and any errors in dictation or transcription are unintentional.  Donnalee CurryJeffrey Medhansh Brinkmeier, M.D., F.A.C.S.  Merrily PewJeffrey W Terik Haughey 04/01/2017, 6:16 AM

## 2017-04-06 ENCOUNTER — Telehealth: Payer: Self-pay | Admitting: *Deleted

## 2017-04-06 ENCOUNTER — Ambulatory Visit (INDEPENDENT_AMBULATORY_CARE_PROVIDER_SITE_OTHER): Payer: PRIVATE HEALTH INSURANCE | Admitting: General Surgery

## 2017-04-06 ENCOUNTER — Encounter: Payer: Self-pay | Admitting: General Surgery

## 2017-04-06 VITALS — BP 154/92 | HR 100 | Temp 97.7°F | Resp 11 | Ht 71.0 in | Wt 170.0 lb

## 2017-04-06 DIAGNOSIS — L0231 Cutaneous abscess of buttock: Secondary | ICD-10-CM | POA: Diagnosis not present

## 2017-04-06 MED ORDER — SULFAMETHOXAZOLE-TRIMETHOPRIM 800-160 MG PO TABS: 1.0000 | ORAL_TABLET | Freq: Two times a day (BID) | ORAL | 0 refills | Status: DC

## 2017-04-06 NOTE — Telephone Encounter (Signed)
Patient called stating that he was going to call you back to let you know what kind of blood pressure medicine he was on, he is taking Enalapril 20mg  q.d

## 2017-04-06 NOTE — Patient Instructions (Signed)
The patient is aware to call back for any questions or concerns.  

## 2017-04-06 NOTE — Progress Notes (Signed)
Patient ID: Antonio Parks, male   DOB: 09/16/1965, 52 y.o.   MRN: [id number]  Chief Complaint  Patient presents with  . Follow-up    HPI Antonio CharyMichael Dwayne Parks is a 52 y.o. male.  He came in because he states that over the weekend he noticed a new knot near his surgery incision. He states it has gotten larger "fifty cent piece" in size. No drainage from that area. The incision area is still draining some. Bowels moving daily. HPI  Past Medical History:  Diagnosis Date  . Asthma    AS A CHILD-NO INHALERS  . Elevated blood pressure reading    NO MEDS  . GERD (gastroesophageal reflux disease)    TUMS OCC    Past Surgical History:  Procedure Laterality Date  . APPENDECTOMY  as child  . FISTULOTOMY N/A 03/22/2017   Procedure: FISTULOTOMY;  Surgeon: Earline MayotteByrnett, Jenness Stemler W, MD;  Location: [facility] OR[facility];  Service: General;  Laterality: N/A;    Family History  Problem Relation Age of Onset  . Skin cancer Mother   . Bone cancer Maternal Aunt     Social History Social History   Tobacco Use  . Smoking status: Never Smoker  . Smokeless tobacco: Current User    Types: Chew  Substance Use Topics  . Alcohol use: Yes    Comment: 4-5 beer per day  . Drug use: No    Allergies  Allergen Reactions  . Penicillins Rash    Has patient had a PCN reaction causing immediate rash, facial/tongue/throat swelling, SOB or lightheadedness with hypotension: Yes Has patient had a PCN reaction causing severe rash involving mucus membranes or skin necrosis: No Has patient had a PCN reaction that required hospitalization: No Has patient had a PCN reaction occurring within the last 10 years: No If all of the above answers are "NO", then may proceed with Cephalosporin use.     Current Outpatient Medications  Medication Sig Dispense Refill  . acetaminophen (TYLENOL) 500 MG tablet Take 1,000 mg by mouth 2 (two) times daily as needed for moderate pain or headache.    . enalapril (VASOTEC) 20 MG tablet Take  20 mg by mouth daily.    . Misc. Devices (SITZ BATH) MISC Place 1 Units into the perineum 4 (four) times daily as needed. 1 each 0  . sulfamethoxazole-trimethoprim (BACTRIM DS,SEPTRA DS) 800-160 MG tablet Take 1 tablet by mouth 2 (two) times daily for 10 days. 20 tablet 0   No current facility-administered medications for this visit.     Review of Systems Review of Systems  Constitutional: Negative.   Respiratory: Negative.   Cardiovascular: Negative.     Blood pressure (!) 154/92, pulse 100, temperature 97.7 F (36.5 C), temperature source Oral, resp. rate 11, height 5\' 11"  (1.803 m), weight 170 lb (77.1 kg), SpO2 98 %.  Physical Exam Physical Exam  Constitutional: He is oriented to person, place, and time. He appears well-developed and well-nourished.  Genitourinary:     Neurological: He is alert and oriented to person, place, and time.  Skin: Skin is warm and dry.  Psychiatric: His behavior is normal.    Data Reviewed The patient was amenable to incision and drainage.  Of 0.5% Xylocaine with 0.25% Marcaine with 1-200,000 units of epinephrine was utilized and well-tolerated.  ChloraPrep was applied to the skin.  A 1 cm radial incision was made with scant amount of fluid and the pocket explored with a hemostat.  No deep loculation.  No tracking to  the anus.  Dry dressing applied.  Procedure was well-tolerated.  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  Gluteal abscess.    Plan    Patient will apply warm compresses to the area and gauze pad as needed for drainage.     Bactrim Ds Bid x 10 days HPI, Physical Exam, Assessment and Plan have been scribed under the direction and in the presence of Earline Mayotte, MD. Dorathy Daft, RN  I have completed the exam and reviewed the above documentation for accuracy and completeness.  I agree with the above.  Museum/gallery conservator has been used and any errors in dictation or transcription are  unintentional.  Donnalee Curry, M.D., F.A.C.S.  [name] 04/07/2017, 1:02 PM

## 2017-04-07 DIAGNOSIS — L0231 Cutaneous abscess of buttock: Secondary | ICD-10-CM | POA: Insufficient documentation

## 2017-04-13 ENCOUNTER — Encounter: Payer: Self-pay | Admitting: General Surgery

## 2017-04-13 ENCOUNTER — Ambulatory Visit (INDEPENDENT_AMBULATORY_CARE_PROVIDER_SITE_OTHER): Payer: PRIVATE HEALTH INSURANCE | Admitting: General Surgery

## 2017-04-13 VITALS — BP 130/78 | HR 108 | Resp 12 | Ht 71.0 in | Wt 167.0 lb

## 2017-04-13 DIAGNOSIS — L0231 Cutaneous abscess of buttock: Secondary | ICD-10-CM

## 2017-04-13 DIAGNOSIS — K603 Anal fistula: Secondary | ICD-10-CM

## 2017-04-13 NOTE — Patient Instructions (Signed)
Return in three weeks. The patient is aware to call back for any questions or concerns.  

## 2017-04-13 NOTE — Progress Notes (Signed)
Patient ID: Antonio Parks, male   DOB: 06-29-1965, 52 y.o.   MRN: [id number]  Chief Complaint  Patient presents with  . Routine Post Op    HPI Antonio Parks is a 52 y.o. male here today for is follow up fistulectomy done on 03/22/2017. Patient states he is doing much better. The area is still sore and draining. Some bleeding after bowel movements.  HPI  Past Medical History:  Diagnosis Date  . Asthma    AS A CHILD-NO INHALERS  . Elevated blood pressure reading    NO MEDS  . GERD (gastroesophageal reflux disease)    TUMS OCC    Past Surgical History:  Procedure Laterality Date  . APPENDECTOMY  as child  . FISTULOTOMY N/A 03/22/2017   Procedure: FISTULOTOMY;  Surgeon: Earline MayotteByrnett, Aldred Mase W, MD;  Location: [facility];  Service: General;  Laterality: N/A;    Family History  Problem Relation Age of Onset  . Skin cancer Mother   . Bone cancer Maternal Aunt     Social History Social History   Tobacco Use  . Smoking status: Never Smoker  . Smokeless tobacco: Current User    Types: Chew  Substance Use Topics  . Alcohol use: Yes    Comment: 4-5 beer per day  . Drug use: No    Allergies  Allergen Reactions  . Penicillins Rash    Has patient had a PCN reaction causing immediate rash, facial/tongue/throat swelling, SOB or lightheadedness with hypotension: Yes Has patient had a PCN reaction causing severe rash involving mucus membranes or skin necrosis: No Has patient had a PCN reaction that required hospitalization: No Has patient had a PCN reaction occurring within the last 10 years: No If all of the above answers are "NO", then may proceed with Cephalosporin use.     Current Outpatient Medications  Medication Sig Dispense Refill  . acetaminophen (TYLENOL) 500 MG tablet Take 1,000 mg by mouth 2 (two) times daily as needed for moderate pain or headache.    . enalapril (VASOTEC) 20 MG tablet Take 20 mg by mouth daily.    . Misc. Devices (SITZ BATH) MISC Place 1 Units  into the perineum 4 (four) times daily as needed. 1 each 0   No current facility-administered medications for this visit.     Review of Systems Review of Systems  Constitutional: Negative.   Respiratory: Negative.   Cardiovascular: Negative.     Blood pressure 130/78, pulse (!) 108, resp. rate 12, height 5\' 11"  (1.803 m), weight 167 lb (75.8 kg).  Physical Exam Physical Exam  Constitutional: He is oriented to person, place, and time. He appears well-developed and well-nourished.  Genitourinary:     Neurological: He is alert and oriented to person, place, and time.  Skin: Skin is warm and dry.       Assessment    Fistulotomy doing well.  Posterior gluteal abscess slowly resolving.    Plan    Return in three weeks. The patient is aware to call back for any questions or concerns.   HPI, Physical Exam, Assessment and Plan have been scribed under the direction and in the presence of Donnalee CurryJeffrey Kahlel Peake, MD.  Ples SpecterJessica Qualls, CMA   I have completed the exam and reviewed the above documentation for accuracy and completeness.  I agree with the above.  Museum/gallery conserv[employer] has been used and any errors in dictation or transcription are unintentional.  Donnalee CurryJeffrey Yahya Boldman, M.D., F.A.C.S.   Antonio Parks 04/13/2017, 9:51 AM

## 2017-04-14 ENCOUNTER — Telehealth: Payer: Self-pay | Admitting: *Deleted

## 2017-04-14 NOTE — Telephone Encounter (Signed)
  FMLA papers completed for pick up

## 2017-05-04 ENCOUNTER — Ambulatory Visit (INDEPENDENT_AMBULATORY_CARE_PROVIDER_SITE_OTHER): Payer: PRIVATE HEALTH INSURANCE | Admitting: General Surgery

## 2017-05-04 ENCOUNTER — Encounter: Payer: Self-pay | Admitting: General Surgery

## 2017-05-04 VITALS — BP 118/80 | HR 82 | Resp 12 | Ht 71.0 in | Wt 171.0 lb

## 2017-05-04 DIAGNOSIS — K603 Anal fistula: Secondary | ICD-10-CM

## 2017-05-04 NOTE — Progress Notes (Signed)
Patient ID: Antonio Parks, male   DOB: 03-11-1965, 52 y.o.   MRN: [id number]  Chief Complaint  Patient presents with  . Routine Post Op    HPI Antonio Parks is a 52 y.o. male here today for his follow up fistulectomy done on 03/22/2017. Patient states the area is not draining. He is doing well.  The patient denies any pain.  No difficulty with control of bowel function. HPI  Past Medical History:  Diagnosis Date  . Asthma    AS A CHILD-NO INHALERS  . Elevated blood pressure reading    NO MEDS  . GERD (gastroesophageal reflux disease)    TUMS OCC    Past Surgical History:  Procedure Laterality Date  . APPENDECTOMY  as child  . FISTULOTOMY N/A 03/22/2017   Procedure: FISTULOTOMY;  Surgeon: Earline Mayotte, MD;  Location: [facility];  Service: General;  Laterality: N/A;    Family History  Problem Relation Age of Onset  . Skin cancer Mother   . Bone cancer Maternal Aunt     Social History Social History   Tobacco Use  . Smoking status: Never Smoker  . Smokeless tobacco: Current User    Types: Chew  Substance Use Topics  . Alcohol use: Yes    Comment: 4-5 beer per day  . Drug use: No    Allergies  Allergen Reactions  . Penicillins Rash    Has patient had a PCN reaction causing immediate rash, facial/tongue/throat swelling, SOB or lightheadedness with hypotension: Yes Has patient had a PCN reaction causing severe rash involving mucus membranes or skin necrosis: No Has patient had a PCN reaction that required hospitalization: No Has patient had a PCN reaction occurring within the last 10 years: No If all of the above answers are "NO", then may proceed with Cephalosporin use.     Current Outpatient Medications  Medication Sig Dispense Refill  . acetaminophen (TYLENOL) 500 MG tablet Take 1,000 mg by mouth 2 (two) times daily as needed for moderate pain or headache.    . enalapril (VASOTEC) 20 MG tablet Take 20 mg by mouth daily.    . Misc. Devices (SITZ  BATH) MISC Place 1 Units into the perineum 4 (four) times daily as needed. 1 each 0   No current facility-administered medications for this visit.     Review of Systems Review of Systems  Constitutional: Negative.   Respiratory: Negative.   Cardiovascular: Negative.     Blood pressure 118/80, pulse 82, resp. rate 12, height 5\' 11"  (1.803 m), weight 171 lb (77.6 kg).  Physical Exam Physical Exam  Constitutional: He is oriented to person, place, and time. He appears well-developed and well-nourished.  Genitourinary:     Neurological: He is alert and oriented to person, place, and time.  Skin: Skin is warm and dry.       Assessment    Steady progress in healing of the left anal fistula.  Silver nitrate applied to the wound to accelerate epithelial coverage.    Plan  Return in one month.. The patient is aware to call back for any questions or concerns.   HPI, Physical Exam, Assessment and Plan have been scribed under the direction and in the presence of Donnalee Curry, MD.  Ples Specter, CMA  I have completed the exam and reviewed the above documentation for accuracy and completeness.  I agree with the above.  Museum/gallery conservator has been used and any errors in dictation or transcription are unintentional.  Tinnie Gens  Lemar LivingsByrnett, M.D., F.A.C.S.   Antonio Parks 05/04/2017, 9:01 PM

## 2017-05-04 NOTE — Patient Instructions (Addendum)
Return in one month. The patient is aware to call back for any questions or concerns.  

## 2017-06-01 ENCOUNTER — Encounter: Payer: Self-pay | Admitting: General Surgery

## 2017-06-01 ENCOUNTER — Ambulatory Visit (INDEPENDENT_AMBULATORY_CARE_PROVIDER_SITE_OTHER): Payer: PRIVATE HEALTH INSURANCE | Admitting: General Surgery

## 2017-06-01 VITALS — BP 132/76 | HR 84 | Resp 12 | Ht 69.0 in | Wt 170.0 lb

## 2017-06-01 DIAGNOSIS — K603 Anal fistula: Secondary | ICD-10-CM

## 2017-06-01 NOTE — Patient Instructions (Signed)
Return in two weeks.  

## 2017-06-01 NOTE — Progress Notes (Signed)
Patient ID: Antonio Parks, male   DOB: May 29, 1965, 52 y.o.   MRN: [id number]  Chief Complaint  Patient presents with  . Follow-up    HPI Antonio CharyMichael Dwayne Guedes is a 52 y.o. male  here today for his follow up fistulectomy done on 03/22/2017. Patient states the area is not draining. He is doing well.  The patient denies any pain.  HPI  Past Medical History:  Diagnosis Date  . Asthma    AS A CHILD-NO INHALERS  . Elevated blood pressure reading    NO MEDS  . GERD (gastroesophageal reflux disease)    TUMS OCC    Past Surgical History:  Procedure Laterality Date  . APPENDECTOMY  as child  . FISTULOTOMY N/A 03/22/2017   Procedure: FISTULOTOMY;  Surgeon: Earline MayotteByrnett, Jeffrey W, MD;  Location: [facility];  Service: General;  Laterality: N/A;    Family History  Problem Relation Age of Onset  . Skin cancer Mother   . Bone cancer Maternal Aunt     Social History Social History   Tobacco Use  . Smoking status: Never Smoker  . Smokeless tobacco: Current User    Types: Chew  Substance Use Topics  . Alcohol use: Yes    Comment: 4-5 beer per day  . Drug use: No    Allergies  Allergen Reactions  . Penicillins Rash    Has patient had a PCN reaction causing immediate rash, facial/tongue/throat swelling, SOB or lightheadedness with hypotension: Yes Has patient had a PCN reaction causing severe rash involving mucus membranes or skin necrosis: No Has patient had a PCN reaction that required hospitalization: No Has patient had a PCN reaction occurring within the last 10 years: No If all of the above answers are "NO", then may proceed with Cephalosporin use.     Current Outpatient Medications  Medication Sig Dispense Refill  . acetaminophen (TYLENOL) 500 MG tablet Take 1,000 mg by mouth 2 (two) times daily as needed for moderate pain or headache.    . enalapril (VASOTEC) 20 MG tablet Take 20 mg by mouth daily.    . Misc. Devices (SITZ BATH) MISC Place 1 Units into the perineum 4 (four)  times daily as needed. 1 each 0   No current facility-administered medications for this visit.     Review of Systems Review of Systems  Constitutional: Negative.   Respiratory: Negative.   Cardiovascular: Negative.     Blood pressure 132/76, pulse 84, resp. rate 12, height 5\' 9"  (1.753 m), weight 170 lb (77.1 kg).  Physical Exam Physical Exam  Constitutional: He is oriented to person, place, and time. He appears well-developed.  Genitourinary:     Neurological: He is alert and oriented to person, place, and time.  Skin: Skin is warm and dry.   1 cm resolving right buttock abscess at 10 o'clock.      Assessment    Slow progress on healing of the fistulotomy site.    Plan   Return in two weeks. The patient is aware to call back for any questions or concerns.    HPI, Physical Exam, Assessment and Plan have been scribed under the direction and in the presence of Donnalee CurryJeffrey Byrnett, MD.  Ples SpecterJessica Qualls, CMA  I have completed the exam and reviewed the above documentation for accuracy and completeness.  I agree with the above.  Museum/gallery conservatorDragon [employer] has been used and any errors in dictation or transcription are unintentional.  Donnalee CurryJeffrey Byrnett, M.D., F.A.C.S.  Merrily PewJeffrey W Byrnett 06/02/2017, 8:16 PM

## 2017-06-17 ENCOUNTER — Ambulatory Visit (INDEPENDENT_AMBULATORY_CARE_PROVIDER_SITE_OTHER): Payer: PRIVATE HEALTH INSURANCE | Admitting: General Surgery

## 2017-06-17 ENCOUNTER — Encounter: Payer: Self-pay | Admitting: General Surgery

## 2017-06-17 VITALS — BP 128/92 | HR 85 | Resp 14 | Ht 69.0 in | Wt 169.0 lb

## 2017-06-17 DIAGNOSIS — K603 Anal fistula: Secondary | ICD-10-CM

## 2017-06-17 NOTE — Progress Notes (Signed)
Patient ID: Antonio Parks, male   DOB: [birth year], 52 y.o.   MRN: [id number]  Chief Complaint  Patient presents with  . Follow-up    HPI Antonio Parks is a 52 y.o. male.  Here for follow up anal fistulotomy done 03-22-17. No drainage and no pain.  HPI  Past Medical History:  Diagnosis Date  . Asthma    AS A CHILD-NO INHALERS  . Elevated blood pressure reading    NO MEDS  . GERD (gastroesophageal reflux disease)    TUMS OCC    Past Surgical History:  Procedure Laterality Date  . APPENDECTOMY  as child  . FISTULOTOMY N/A 03/22/2017   Procedure: FISTULOTOMY;  Surgeon: Earline Mayotte, MD;  Location: [facility] OR[facility];  Service: General;  Laterality: N/A;    Family History  Problem Relation Age of Onset  . Skin cancer Mother   . Bone cancer Maternal Aunt     Social History Social History   Tobacco Use  . Smoking status: Never Smoker  . Smokeless tobacco: Current User    Types: Chew  Substance Use Topics  . Alcohol use: Yes    Comment: 4-5 beer per day  . Drug use: No    Allergies  Allergen Reactions  . Penicillins Rash    Has patient had a PCN reaction causing immediate rash, facial/tongue/throat swelling, SOB or lightheadedness with hypotension: Yes Has patient had a PCN reaction causing severe rash involving mucus membranes or skin necrosis: No Has patient had a PCN reaction that required hospitalization: No Has patient had a PCN reaction occurring within the last 10 years: No If all of the above answers are "NO", then may proceed with Cephalosporin use.     Current Outpatient Medications  Medication Sig Dispense Refill  . enalapril (VASOTEC) 20 MG tablet Take 20 mg by mouth daily.    Marland Kitchen ibuprofen (ADVIL,MOTRIN) 200 MG tablet Take 200 mg by mouth every 6 (six) hours as needed.    . Misc. Devices (SITZ BATH) MISC Place 1 Units into the perineum 4 (four) times daily as needed. 1 each 0   No current facility-administered medications for this visit.      Review of Systems Review of Systems  Constitutional: Negative.   Respiratory: Negative.   Cardiovascular: Negative.     Blood pressure (!) 128/92, pulse 85, resp. rate 14, height 5\' 9"  (1.753 m), weight 169 lb (76.7 kg), SpO2 99 %.  Physical Exam Physical Exam  Constitutional: He is oriented to person, place, and time. He appears well-developed and well-nourished.  Genitourinary:  Genitourinary Comments: Small area of healing tissue, silver nitrate application  Neurological: He is alert and oriented to person, place, and time.  Skin: Skin is warm and dry.  Psychiatric: His behavior is normal.  Exam again shows a area of granulation tissue, less prominent than on on last exam after silver nitrate treatment.  Nontender.  Area debrided with a gauze sponge followed by silver nitrate.    Assessment    Slow healing of anal fistula, asymptomatic    Plan    Follow up in 2 weeks     HPI, Physical Exam, Assessment and Plan have been scribed under the direction and in the presence of Earline Mayotte, MD. Dorathy Daft, RN  I have completed the exam and reviewed the above documentation for accuracy and completeness.  I agree with the above.  Museum/gallery conservator has been used and any errors in dictation or transcription are unintentional.  Tinnie Gens  Lemar LivingsByrnett, M.D., F.A.C.S.  Merrily PewJeffrey W Odessa Morren 06/17/2017, 8:18 PM

## 2017-06-17 NOTE — Patient Instructions (Signed)
The patient is aware to call back for any questions or concerns.  

## 2017-07-01 ENCOUNTER — Ambulatory Visit (INDEPENDENT_AMBULATORY_CARE_PROVIDER_SITE_OTHER): Payer: PRIVATE HEALTH INSURANCE | Admitting: General Surgery

## 2017-07-01 ENCOUNTER — Encounter: Payer: Self-pay | Admitting: General Surgery

## 2017-07-01 VITALS — BP 152/92 | HR 98 | Resp 16 | Ht 69.0 in | Wt 168.0 lb

## 2017-07-01 DIAGNOSIS — K603 Anal fistula: Secondary | ICD-10-CM

## 2017-07-01 NOTE — Progress Notes (Signed)
Patient ID: Raynelle CharyMichael Dwayne Vanderbeck, male   DOB: Sep 08, 1965, 52 y.o.   MRN: [id number]  Chief Complaint  Patient presents with  . Routine Post Op    HPI Raynelle CharyMichael Dwayne Pfohl is a 52 y.o. male.  Here for follow up anal fistulotomy done 03-22-17. No drainage and no pain.  HPI  Past Medical History:  Diagnosis Date  . Asthma    AS A CHILD-NO INHALERS  . Elevated blood pressure reading    NO MEDS  . GERD (gastroesophageal reflux disease)    TUMS OCC    Past Surgical History:  Procedure Laterality Date  . APPENDECTOMY  as child  . FISTULOTOMY N/A 03/22/2017   Procedure: FISTULOTOMY;  Surgeon: Earline MayotteByrnett, Dannia Snook W, MD;  Location: [facility] ORS;  Service: General;  Laterality: N/A;    Family History  Problem Relation Age of Onset  . Skin cancer Mother   . Bone cancer Maternal Aunt     Social History Social History   Tobacco Use  . Smoking status: Never Smoker  . Smokeless tobacco: Current User    Types: Chew  Substance Use Topics  . Alcohol use: Yes    Comment: 4-5 beer per day  . Drug use: No    Allergies  Allergen Reactions  . Penicillins Rash    Has patient had a PCN reaction causing immediate rash, facial/tongue/throat swelling, SOB or lightheadedness with hypotension: Yes Has patient had a PCN reaction causing severe rash involving mucus membranes or skin necrosis: No Has patient had a PCN reaction that required hospitalization: No Has patient had a PCN reaction occurring within the last 10 years: No If all of the above answers are "NO", then may proceed with Cephalosporin use.     Current Outpatient Medications  Medication Sig Dispense Refill  . enalapril (VASOTEC) 20 MG tablet Take 20 mg by mouth daily.    Marland Kitchen. ibuprofen (ADVIL,MOTRIN) 200 MG tablet Take 200 mg by mouth every 6 (six) hours as needed.    . Misc. Devices (SITZ BATH) MISC Place 1 Units into the perineum 4 (four) times daily as needed. 1 each 0   No current facility-administered medications for this visit.      Review of Systems Review of Systems  Constitutional: Negative.   Respiratory: Negative.   Cardiovascular: Negative.     Blood pressure (!) 152/92, pulse 98, resp. rate 16, height 5\' 9"  (1.753 m), weight 168 lb (76.2 kg).  Physical Exam Physical Exam  Constitutional: He is oriented to person, place, and time. He appears well-developed and well-nourished.  Genitourinary:     Genitourinary Comments: Small amount irritated anal tissue, silver nitrate application  Neurological: He is alert and oriented to person, place, and time.  Skin: Skin is warm and dry.  Psychiatric: His behavior is normal.    Data Reviewed Silver nitrate applied.  Assessment    Asymptomatic post fistulotomy, persistent granulation tissue.    Plan    Follow up in 2 weeks.    HPI, Physical Exam, Assessment and Plan have been scribed under the direction and in the presence of Earline MayotteJeffrey W. Rome Echavarria, MD. Dorathy DaftMarsha Hatch, RN  I have completed the exam and reviewed the above documentation for accuracy and completeness.  I agree with the above.  Museum[employer]gallery [employer] has been used and any errors in dictation or transcription are unintentional.  Donnalee CurryJeffrey Berea Majkowski, M.D., F.A.C.S.   Merrily PewJeffrey W Krislynn Gronau 07/01/2017, 2:17 PM

## 2017-07-01 NOTE — Patient Instructions (Signed)
The patient is aware to call back for any questions or concerns.  

## 2017-07-15 ENCOUNTER — Encounter: Payer: Self-pay | Admitting: General Surgery

## 2017-07-15 ENCOUNTER — Ambulatory Visit (INDEPENDENT_AMBULATORY_CARE_PROVIDER_SITE_OTHER): Payer: PRIVATE HEALTH INSURANCE | Admitting: General Surgery

## 2017-07-15 VITALS — BP 128/78 | HR 74 | Resp 14 | Ht 68.0 in | Wt 168.0 lb

## 2017-07-15 DIAGNOSIS — K603 Anal fistula: Secondary | ICD-10-CM

## 2017-07-15 NOTE — Progress Notes (Signed)
Patient ID: Antonio Parks, male   DOB: 22-Mar-1965, 52 y.o.   MRN: [id number]  Chief Complaint  Patient presents with  . Routine Post Op    HPI Antonio Parks is a 52 y.o. male. Here today for his follow up fistulotomy done on 03/22/2017. Patient states he is doing well.  HPI  Past Medical History:  Diagnosis Date  . Asthma    AS A CHILD-NO INHALERS  . Elevated blood pressure reading    NO MEDS  . GERD (gastroesophageal reflux disease)    TUMS OCC    Past Surgical History:  Procedure Laterality Date  . APPENDECTOMY  as child  . FISTULOTOMY N/A 03/22/2017   Procedure: FISTULOTOMY;  Surgeon: Earline Mayotte, MD;  Location: [facility] ORS;  Service: General;  Laterality: N/A;    Family History  Problem Relation Age of Onset  . Skin cancer Mother   . Bone cancer Maternal Aunt     Social History Social History   Tobacco Use  . Smoking status: Never Smoker  . Smokeless tobacco: Current User    Types: Chew  Substance Use Topics  . Alcohol use: Yes    Comment: 4-5 beer per day  . Drug use: No    Allergies  Allergen Reactions  . Penicillins Rash    Has patient had a PCN reaction causing immediate rash, facial/tongue/throat swelling, SOB or lightheadedness with hypotension: Yes Has patient had a PCN reaction causing severe rash involving mucus membranes or skin necrosis: No Has patient had a PCN reaction that required hospitalization: No Has patient had a PCN reaction occurring within the last 10 years: No If all of the above answers are "NO", then may proceed with Cephalosporin use.     Current Outpatient Medications  Medication Sig Dispense Refill  . enalapril (VASOTEC) 20 MG tablet Take 20 mg by mouth daily.    Marland Kitchen ibuprofen (ADVIL,MOTRIN) 200 MG tablet Take 200 mg by mouth every 6 (six) hours as needed.    . Misc. Devices (SITZ BATH) MISC Place 1 Units into the perineum 4 (four) times daily as needed. 1 each 0   No current facility-administered medications  for this visit.     Review of Systems Review of Systems  Constitutional: Negative.   Respiratory: Negative.   Cardiovascular: Negative.     Blood pressure 128/78, pulse 74, resp. rate 14, height  (1.727 m), weight 168 lb (76.2 kg).  Physical Exam Physical Exam Marked improvement in granulation tissue.  Decreased to about 5 x 8 mm.  Silver nitrate applied.      Assessment    Steady resolution of granulation tissue post fistulotomy.    Plan  Patient to return in two weeks. The patient is aware to call back for any questions or concerns.   HPI, Physical Exam, Assessment and Plan have been scribed under the direction and in the presence of Donnalee Curry, MD.  Ples Specter, CMA    Merrily Pew Chai Verdejo 07/15/2017, 1:50 PM

## 2017-07-15 NOTE — Patient Instructions (Signed)
  Patient to return in two weeks. The patient is aware to call back for any questions or concerns.    

## 2017-07-29 ENCOUNTER — Ambulatory Visit (INDEPENDENT_AMBULATORY_CARE_PROVIDER_SITE_OTHER): Payer: PRIVATE HEALTH INSURANCE | Admitting: General Surgery

## 2017-07-29 ENCOUNTER — Encounter: Payer: Self-pay | Admitting: General Surgery

## 2017-07-29 VITALS — BP 130/78 | HR 70 | Resp 14 | Ht 68.0 in | Wt 168.0 lb

## 2017-07-29 DIAGNOSIS — K603 Anal fistula: Secondary | ICD-10-CM

## 2017-07-29 NOTE — Patient Instructions (Addendum)
Patient to return as needed. The patient is aware to call back for any questions or concerns.Call in September 2019 for colonoscopy.

## 2017-07-29 NOTE — Progress Notes (Signed)
Patient ID: Antonio Parks, male   DOB: 1965-08-29, 52 y.o.   MRN: [id number]  Chief Complaint  Patient presents with  . Follow-up    HPI Antonio Parks is a 52 y.o. male Here today for his follow up fistulotomy done on 03/22/2017. Patient states he is doing well.   HPI  Past Medical History:  Diagnosis Date  . Asthma    AS A CHILD-NO INHALERS  . Elevated blood pressure reading    NO MEDS  . GERD (gastroesophageal reflux disease)    TUMS OCC    Past Surgical History:  Procedure Laterality Date  . APPENDECTOMY  as child  . FISTULOTOMY N/A 03/22/2017   Procedure: FISTULOTOMY;  Surgeon: Earline Mayotte, MD;  Location: [facility] ORS;  Service: General;  Laterality: N/A;    Family History  Problem Relation Age of Onset  . Skin cancer Mother   . Bone cancer Maternal Aunt     Social History Social History   Tobacco Use  . Smoking status: Never Smoker  . Smokeless tobacco: Current User    Types: Chew  Substance Use Topics  . Alcohol use: Yes    Comment: 4-5 beer per day  . Drug use: No    Allergies  Allergen Reactions  . Penicillins Rash    Has patient had a PCN reaction causing immediate rash, facial/tongue/throat swelling, SOB or lightheadedness with hypotension: Yes Has patient had a PCN reaction causing severe rash involving mucus membranes or skin necrosis: No Has patient had a PCN reaction that required hospitalization: No Has patient had a PCN reaction occurring within the last 10 years: No If all of the above answers are "NO", then may proceed with Cephalosporin use.     Current Outpatient Medications  Medication Sig Dispense Refill  . enalapril (VASOTEC) 20 MG tablet Take 20 mg by mouth daily.    Marland Kitchen ibuprofen (ADVIL,MOTRIN) 200 MG tablet Take 200 mg by mouth every 6 (six) hours as needed.    . Misc. Devices (SITZ BATH) MISC Place 1 Units into the perineum 4 (four) times daily as needed. 1 each 0   No current facility-administered medications for  this visit.     Review of Systems Review of Systems  Constitutional: Negative.   Respiratory: Negative.   Cardiovascular: Negative.     Blood pressure 130/78, pulse 70, resp. rate 14, height  (1.727 m), weight 168 lb (76.2 kg).  Physical Exam Physical Exam  Genitourinary:         Assessment    Near complete healing of his fistulotomy site.  Candidate for screening colonoscopy.    Plan  Patient to return as needed. The patient is aware to call back for any questions or concerns. Call in September 2019 for colonoscopy.   HPI, Physical Exam, Assessment and Plan have been scribed under the direction and in the presence of Donnalee Curry, MD.  Ples Specter, CMA  I have completed the exam and reviewed the above documentation for accuracy and completeness.  I agree with the above.  Museum/gallery conservator has been used and any errors in dictation or transcription are unintentional.  Donnalee Curry, M.D., F.A.C.S.  Antonio Parks 07/30/2017, 6:17 PM

## 2017-11-02 ENCOUNTER — Telehealth: Payer: Self-pay

## 2017-11-02 NOTE — Telephone Encounter (Signed)
Message left for patient that our September and October surgery schedule is open and he can schedule his colonoscopy.

## 2017-11-15 NOTE — Telephone Encounter (Signed)
Second message left for patient to call and schedule his colonoscopy. No office visit needed as long as history has not changed.

## 2018-02-01 ENCOUNTER — Emergency Department: Payer: PRIVATE HEALTH INSURANCE

## 2018-02-01 ENCOUNTER — Other Ambulatory Visit: Payer: Self-pay

## 2018-02-01 ENCOUNTER — Emergency Department
Admission: EM | Admit: 2018-02-01 | Discharge: 2018-02-01 | Disposition: A | Discharge status: Home / Self Care | Payer: PRIVATE HEALTH INSURANCE | Attending: Emergency Medicine | Admitting: Emergency Medicine

## 2018-02-01 ENCOUNTER — Encounter: Payer: Self-pay | Admitting: Emergency Medicine

## 2018-02-01 DIAGNOSIS — R55 Syncope and collapse: Secondary | ICD-10-CM | POA: Diagnosis not present

## 2018-02-01 DIAGNOSIS — R0789 Other chest pain: Secondary | ICD-10-CM | POA: Insufficient documentation

## 2018-02-01 DIAGNOSIS — K219 Gastro-esophageal reflux disease without esophagitis: Secondary | ICD-10-CM | POA: Diagnosis not present

## 2018-02-01 DIAGNOSIS — F17228 Nicotine dependence, chewing tobacco, with other nicotine-induced disorders: Secondary | ICD-10-CM | POA: Diagnosis not present

## 2018-02-01 DIAGNOSIS — R079 Chest pain, unspecified: Secondary | ICD-10-CM

## 2018-02-01 LAB — BASIC METABOLIC PANEL
Anion gap: 9 (ref 5–15)
BUN: 14 mg/dL (ref 6–20)
CO2: 27 mmol/L (ref 22–32)
CREATININE: 1.05 mg/dL (ref 0.61–1.24)
Calcium: 9.3 mg/dL (ref 8.9–10.3)
Chloride: 100 mmol/L (ref 98–111)
GFR calc Af Amer: >60 mL/min (ref >60)
Glucose, Bld: 117 mg/dL — ABNORMAL HIGH (ref 70–99)
Potassium: 3.8 mmol/L (ref 3.5–5.1)
Sodium: 136 mmol/L (ref 135–145)

## 2018-02-01 LAB — CBC
HCT: 41.8 % (ref 39.0–52.0)
Hemoglobin: 14.1 g/dL (ref 13.0–17.0)
MCH: 32.6 pg (ref 26.0–34.0)
MCHC: 33.7 g/dL (ref 30.0–36.0)
MCV: 96.5 fL (ref 80.0–100.0)
Platelets: 209 10*3/uL (ref 150–400)
RBC: 4.33 MIL/uL (ref 4.22–5.81)
RDW: 12.1 % (ref 11.5–15.5)
WBC: 8.7 10*3/uL (ref 4.0–10.5)
nRBC: 0 % (ref 0.0–0.2)

## 2018-02-01 LAB — TROPONIN I

## 2018-02-01 LAB — FIBRIN DERIVATIVES D-DIMER (ARMC ONLY): FIBRIN DERIVATIVES D-DIMER (ARMC): 400.23 ng{FEU}/mL (ref 0.00–499.00)

## 2018-02-01 NOTE — ED Notes (Signed)
Report received, care of pt assumed. Pt voices no c/o or needs at this time.  Will monitor.

## 2018-02-01 NOTE — ED Provider Notes (Signed)
[facility] Emergency Department Provider Note ____________________________________________   I have reviewed the triage vital signs and the triage nursing note.  HISTORY  Chief Complaint Chest Pain   Historian Patient  HPI Antonio Parks is a 52 y.o. male presents for evaluation of chest pain since yesterday.  He has had some intermittent left lower chest wall pain.  He has had some burping and belching.  He suspects he may have some element of acid reflux.  Eating does not seem to make it better or worse.  This morning he woke up and felt a little bit short of breath although he is no longer short of breath.  Does not describe any pleuritic chest pain.  Currently he states that the pain is essentially gone.  No headache.  No nausea.  No vomiting.  No black or bloody stools.  He was at work today and felt lightheaded and states that he did not lose consciousness but he sort of collapsed.  Does not report history of syncope.  He has an albuterol inhaler that he occasionally uses, does not think he was wheezing but he did try it this morning did not seem to help much.     Past Medical History:  Diagnosis Date  . Asthma    AS A CHILD-NO INHALERS  . Elevated blood pressure reading    NO MEDS  . GERD (gastroesophageal reflux disease)    TUMS OCC    Patient Active Problem List   Diagnosis Date Noted  . 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 APPENDECTOMY  as child  . FISTULOTOMY N/A 03/22/2017   Procedure: FISTULOTOMY;  Surgeon: Earline Mayotte, MD;  Location: [facility] ORS;  Service: General;  Laterality: N/A;    Prior to Admission medications   Medication Sig Start Date End Date Taking? Authorizing Provider  enalapril (VASOTEC) 20 MG tablet Take 20 mg by mouth daily.    [provider]  ibuprofen (ADVIL,MOTRIN) 200 MG tablet Take 200 mg by mouth every 6 (six) hours as needed.    [provider]  Misc. Devices (SITZ BATH) MISC Place 1 Units into the perineum 4 (four) times daily as needed. 03/22/17   Earline Mayotte, MD    Allergies  Allergen Reactions  . Penicillins Rash    Has patient had a PCN reaction causing immediate rash, facial/tongue/throat swelling, SOB or lightheadedness with hypotension: Yes Has patient had a PCN reaction causing severe rash involving mucus membranes or skin necrosis: No Has patient had a PCN reaction that required hospitalization: No Has patient had a PCN reaction occurring within the last 10 years: No If all of the above answers are "NO", then may proceed with Cephalosporin use.     Family History  Problem Relation Age of Onset  . Skin cancer Mother   . Bone cancer Maternal Aunt     Social History Social History   Tobacco Use  . Smoking status: Never Smoker  . Smokeless tobacco: Current User    Types: Chew  Substance Use Topics  . Alcohol use: Yes    Comment: 4-5 beer per day  . Drug use: No    Review of Systems  Constitutional: Negative for fever. Eyes: Negative for visual changes. ENT: Negative for sore throat. Cardiovascular: Positive as per HPI for chest pain. Respiratory: Negative for coughing. Gastrointestinal: Negative for abdominal pain, vomiting and diarrhea. Genitourinary: Negative for dysuria. Musculoskeletal: Negative for back pain.  Skin: Negative for rash. Neurological: Negative for headache.  ____________________________________________   PHYSICAL EXAM:  VITAL SIGNS: ED Triage Vitals  Enc Vitals Group     BP 02/01/18 1008 138/90     Pulse Rate 02/01/18 1008 (!) 109     Resp 02/01/18 1008 18     Temp 02/01/18 1008 97.8 F (36.6 C)     Temp Source 02/01/18 1008 Oral     SpO2 02/01/18 1008 98 %     Weight 02/01/18 1012 165 lb (74.8 kg)     Height 02/01/18 1012 5\' 11"  (1.803 m)     Head Circumference --      Peak Flow --      Pain Score 02/01/18 1012 6     Pain Loc --      Pain Edu?  --      Excl. in GC? --      Constitutional: Alert and oriented.  HEENT      Head: Normocephalic and atraumatic.      Eyes: Conjunctivae are normal. Pupils equal and round.       Ears:         Nose: No congestion/rhinnorhea.      Mouth/Throat: Mucous membranes are moist.      Neck: No stridor. Cardiovascular/Chest: Normal rate, regular rhythm.  No murmurs, rubs, or gallops. Respiratory: Normal respiratory effort without tachypnea nor retractions. Breath sounds are clear and equal bilaterally. No wheezes/rales/rhonchi. Gastrointestinal: Soft. No distention, no guarding, no rebound. Nontender.    Genitourinary/rectal:Deferred Musculoskeletal: Nontender with normal range of motion in all extremities. No joint effusions.  No lower extremity tenderness.  No edema. Neurologic:  Normal speech and language. No gross or focal neurologic deficits are appreciated. Skin:  Skin is warm, dry and intact. No rash noted. Psychiatric: Mood and affect are normal. Speech and behavior are normal. Patient exhibits appropriate insight and judgment.   ____________________________________________  LABS (pertinent positives/negatives) I, Governor Rooks, MD the attending physician have reviewed the labs noted below.  Labs Reviewed  BASIC METABOLIC PANEL - Abnormal; Notable for the following components:      Result Value   Glucose, Bld 117 (*)    All other components within normal limits  CBC  TROPONIN I  FIBRIN DERIVATIVES D-DIMER ([facility] ONLY)    ____________________________________________    EKG I, Governor Rooks, MD, the attending physician have personally viewed and interpreted all ECGs.  105 beats minute.  Sinus tachycardia.  Narrow QRS.  Normal axis.  Normal ST and T wave ____________________________________________  RADIOLOGY   Chest x-ray two-view: No active cardia pulmonary disease. __________________________________________  PROCEDURES  Procedure(s) performed:  None  Procedures  Critical Care performed: None   ____________________________________________  ED COURSE / ASSESSMENT AND PLAN  Pertinent labs & imaging results that were available during my care of the patient were reviewed by me and considered in my medical decision making (see chart for details).     Patient with a nonspecific type chest discomfort which in some ways seems like it could have been musculoskeletal or nonspecific, and otherwise seems like it might be related to GERD.  In any case he is not really currently having symptoms.  He also reports a near syncopal episode this morning.  His EKG showed initial sinus tach around 105, currently heart rate is in the 90s.  Symptoms have been ongoing since yesterday, so repeat troponin I do not think it is helpful at this point time.  We did discuss following up with cardiology  for possible outpatient stress testing for nonspecific chest discomfort.  We discussed conservative management for possible GERD.  Symptoms do not seem consistent with PE.  I think these low likelihood and I did send a d-dimer that was reassuring.      CONSULTATIONS:   None   Patient / Family / Caregiver informed of clinical course, medical decision-making process, and agree with plan.   I discussed return precautions, follow-up instructions, and discharge instructions with patient and/or family.  Discharge Instructions : You are evaluated for chest discomfort, and although no certain cause was found, your exam and evaluation are overall reassuring in the emergency department today.  Return to the emergency room immediately for any worsening condition including dizziness or passing out, or worsening or uncontrolled chest pain, nausea, vomiting blood, black or bloody stools, fever, or any other symptoms concerning to you.    ___________________________________________   FINAL CLINICAL IMPRESSION(S) / ED DIAGNOSES   Final diagnoses:   Nonspecific chest pain  Gastroesophageal reflux disease, esophagitis presence not specified  Near syncope      ___________________________________________         Note: This dictation was prepared with Dragon dictation. Any transcriptional errors that result from this process are unintentional    Governor RooksLord, Kalyn Dimattia, MD 02/01/18 1410

## 2018-02-01 NOTE — ED Notes (Signed)
Patient transported to X-ray 

## 2018-02-01 NOTE — ED Triage Notes (Signed)
Pt arrived via EMS from work with reports of chest pain that started during the evening last night, pt c/o shortness of breath and back pain as well. Pain is reproducible upon palpation on the left side. Pt states he does chew tobacco.  Pt states he has chest pain with movement as well.  Pt was given 2 NTG sprays with EMS and 324 ASA. Pain did not improve with NTG. Pt is alert and oriented at this time.

## 2018-02-01 NOTE — Discharge Instructions (Signed)
You are evaluated for chest discomfort, and although no certain cause was found, your exam and evaluation are overall reassuring in the emergency department today.  Return to the emergency room immediately for any worsening condition including dizziness or passing out, or worsening or uncontrolled chest pain, nausea, vomiting blood, black or bloody stools, fever, or any other symptoms concerning to you.

## 2018-06-19 IMAGING — CT CT ABD-PELV W/ CM
2 of 5 series · 16 of 46 positions shown, 18 images · IV contrast (APPLIED)
Comparison: None.

CLINICAL DATA: Diarrhea for the past 2 weeks. History of drinking
6-7 years at night but none over the past 2 weeks.

EXAM:
CT ABDOMEN AND PELVIS WITH CONTRAST
TECHNIQUE: Multidetector CT imaging of the abdomen and pelvis was performed
using the standard protocol following bolus administration of
intravenous contrast.
CONTRAST:  100mL CXCSFW-SSS IOPAMIDOL (CXCSFW-SSS) INJECTION 61%

[Series 2: axial st · axial · 0.79mm/px · z∈[-433,+2]mm · 13 of 99 slices shown, 15 images]
[im 6/99  soft-tissue]
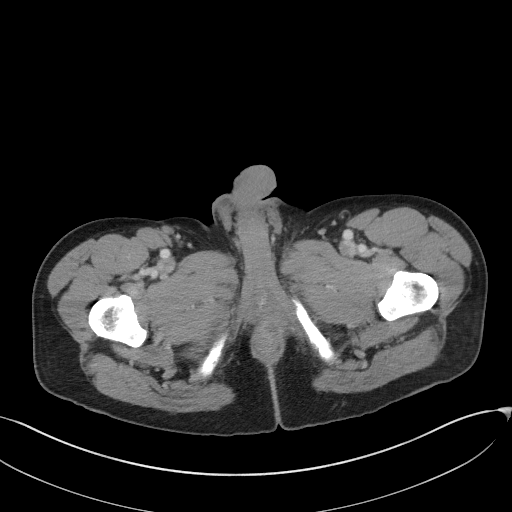
[im 6/99  bone]
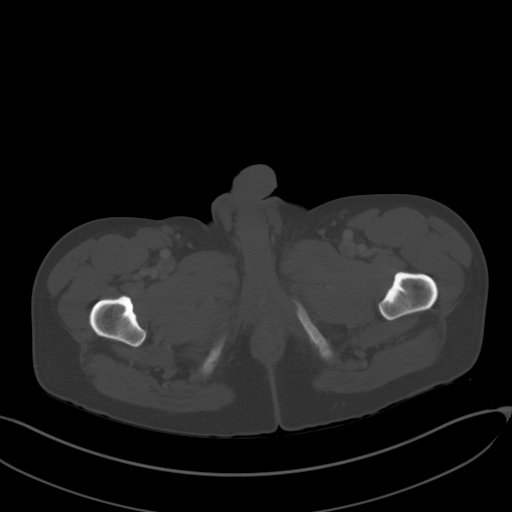
[im 11/99  soft-tissue]
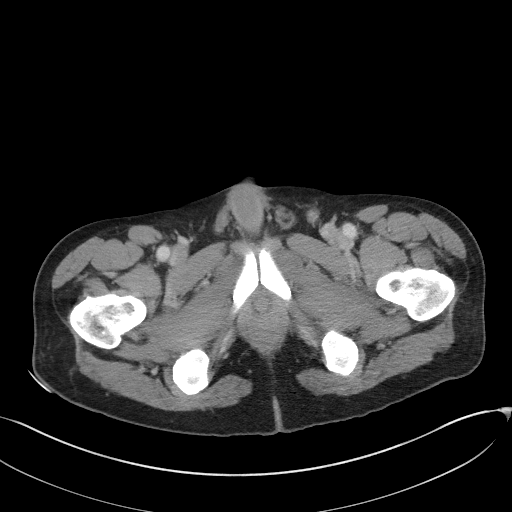
[im 22/99  soft-tissue]
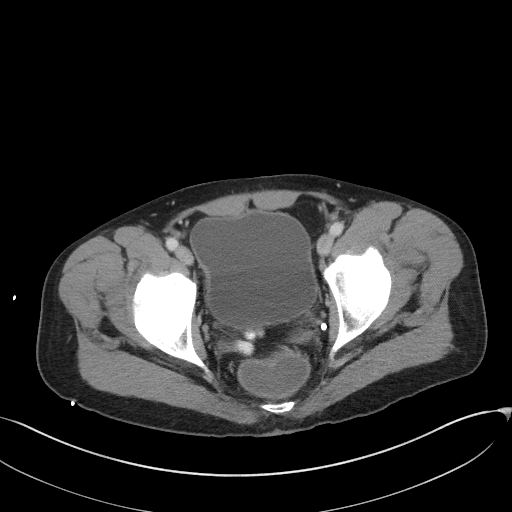
[im 28/99  soft-tissue]
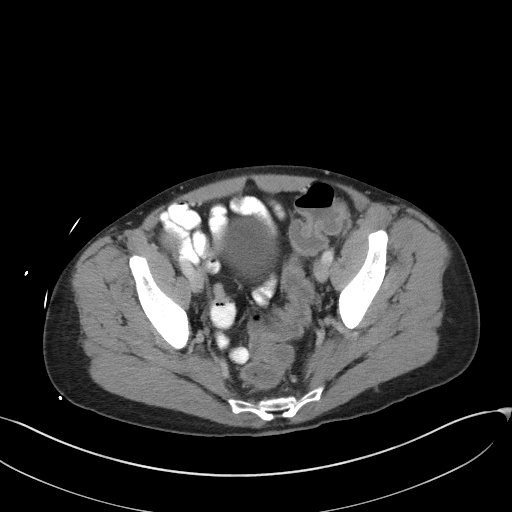
[im 33/99  soft-tissue]
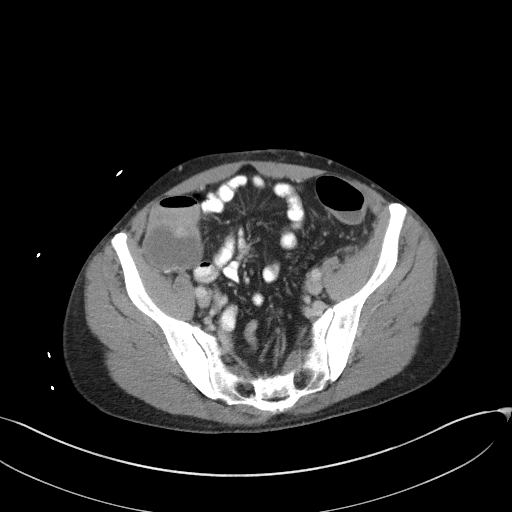
[im 44/99  soft-tissue]
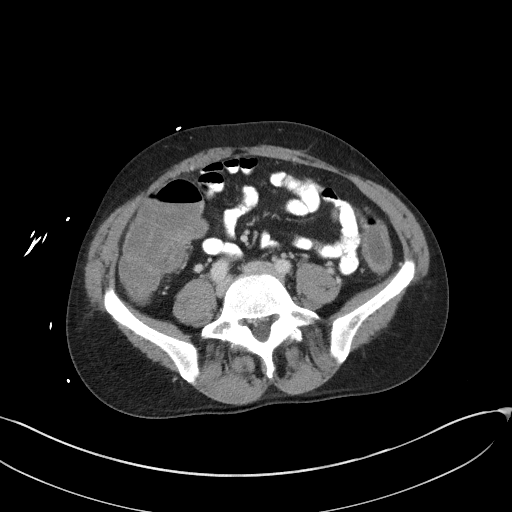
[im 50/99  soft-tissue]
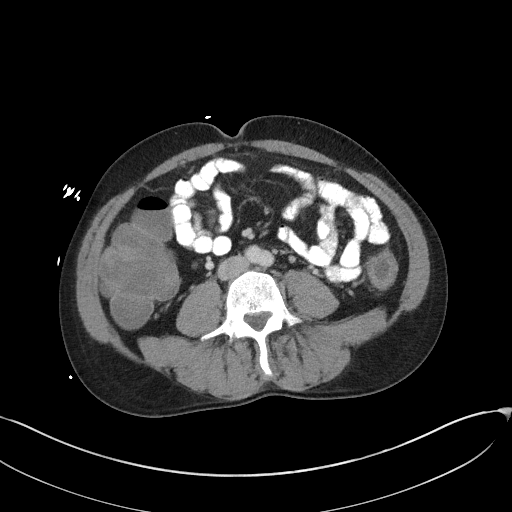
[im 55/99  soft-tissue]
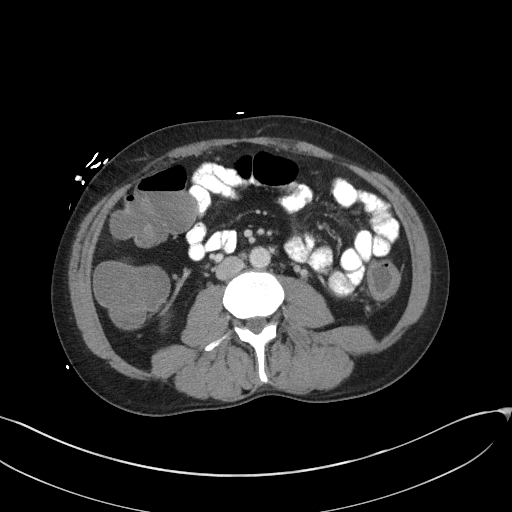
[im 66/99  soft-tissue]
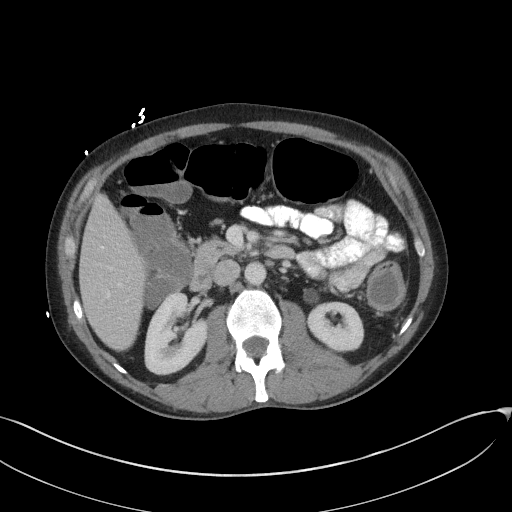
[im 66/99  bone]
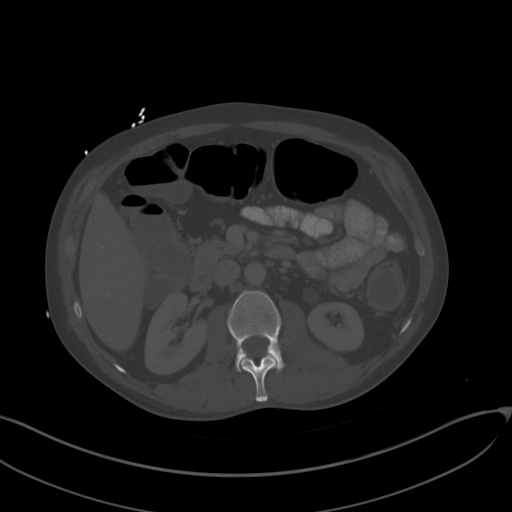
[im 71/99  soft-tissue]
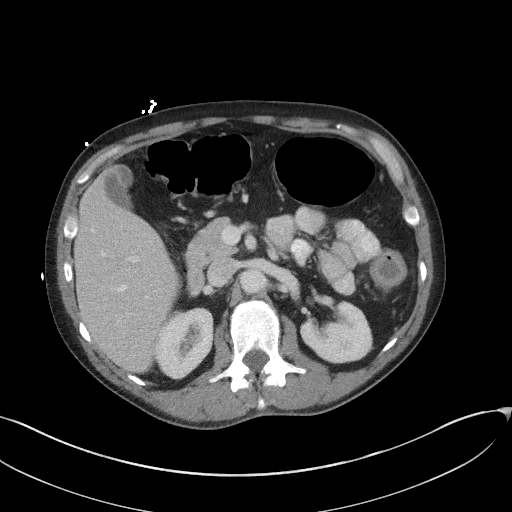
[im 77/99  soft-tissue]
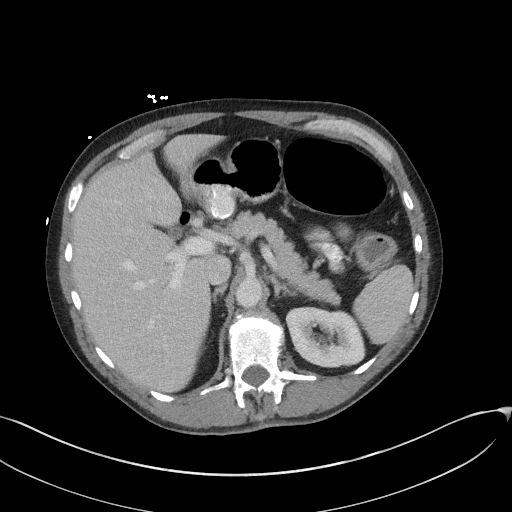
[im 88/99  soft-tissue]
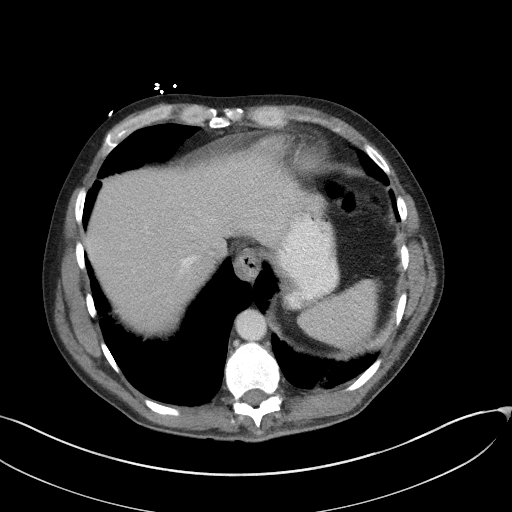
[im 93/99  soft-tissue]
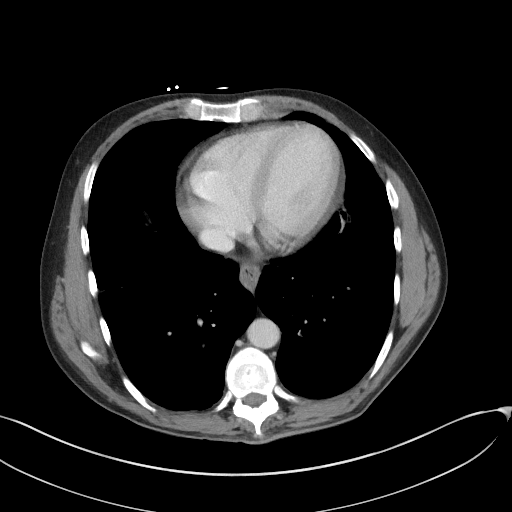

[Series 5: coronal st · coronal · 0.74mm/px · 3 of 91 slices shown]
[im 31/91  soft-tissue]
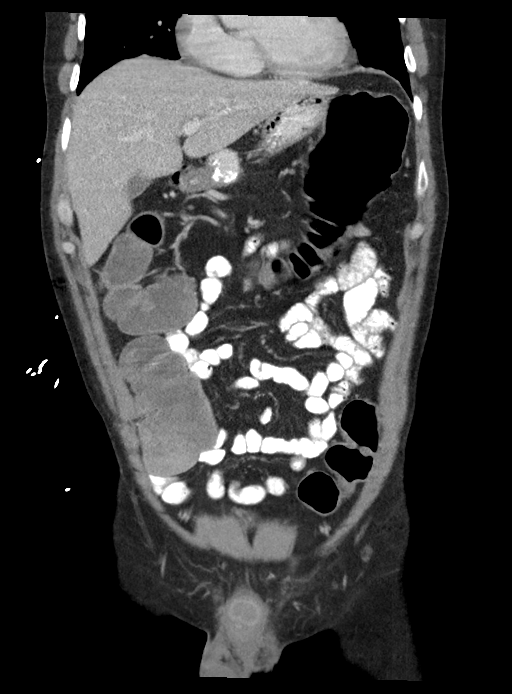
[im 41/91  soft-tissue]
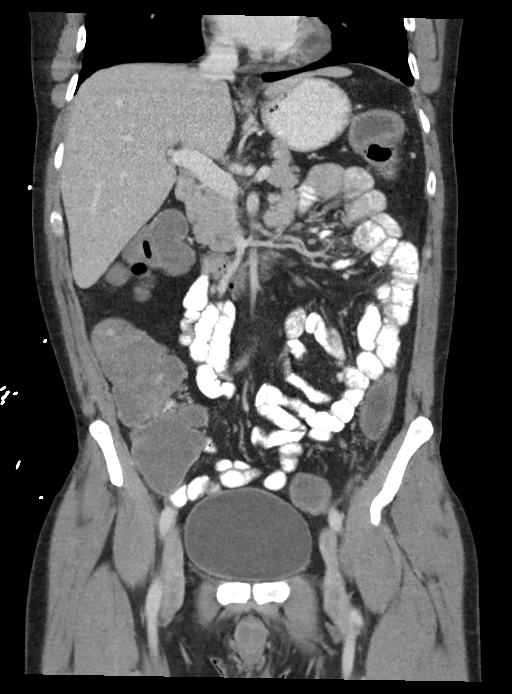
[im 51/91  soft-tissue]
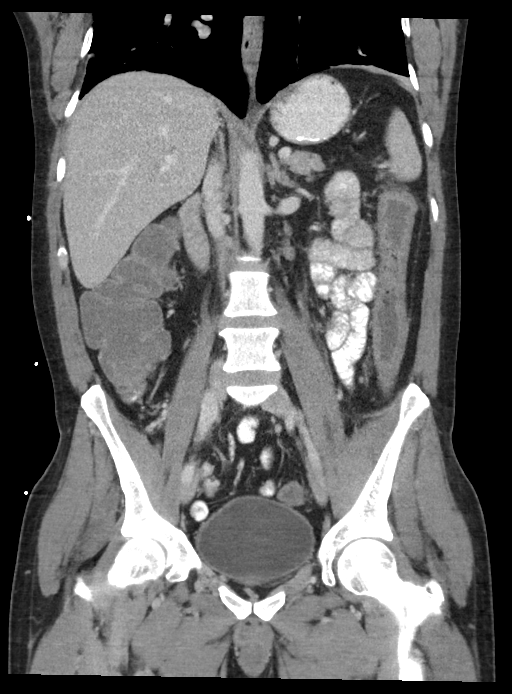

[16 of 46 positions shown; findings below may reference images not displayed]

FINDINGS: Lower chest: Top-normal size heart trace pericardial effusion.
Bibasilar subsegmental atelectasis.

Hepatobiliary: No space-occupying mass of the liver. The gallbladder
is contracted without stones. There is no biliary dilatation.

Pancreas: Normal

Spleen: Normal

Adrenals/Urinary Tract: Normal

Stomach/Bowel: Liquid stool noted within the colon. Moderate diffuse
transmural thickening of the descending colon through rectum
consistent with colitis. The stomach, small intestine and appendix
are normal.

Vascular/Lymphatic: No significant vascular findings are present. No
enlarged abdominal or pelvic lymph nodes.

Reproductive: Prostate is unremarkable.

Other: No abdominal wall hernia or abnormality. No abdominopelvic
ascites.

Musculoskeletal: Mild T8 through L1 degenerative disc disease. No
acute nor suspicious osseous abnormalities.
IMPRESSION: 1. Diffuse moderate transmural thickening of the descending colon
through rectum consistent with colitis possibly postinfectious or
postinflammatory. No bowel obstruction.
2. Mild lower thoracic spondylosis.

## 2018-12-13 IMAGING — CR DG CHEST 2V
1 series · 2 of 2 positions shown · non-contrast
Comparison: 06/07/2013

CLINICAL DATA: Chest pain

EXAM:
CHEST - 2 VIEW

[Series 1: dg chest 2 view · 0.14mm/px · 2 of 2 slices shown]
[im 1/2]
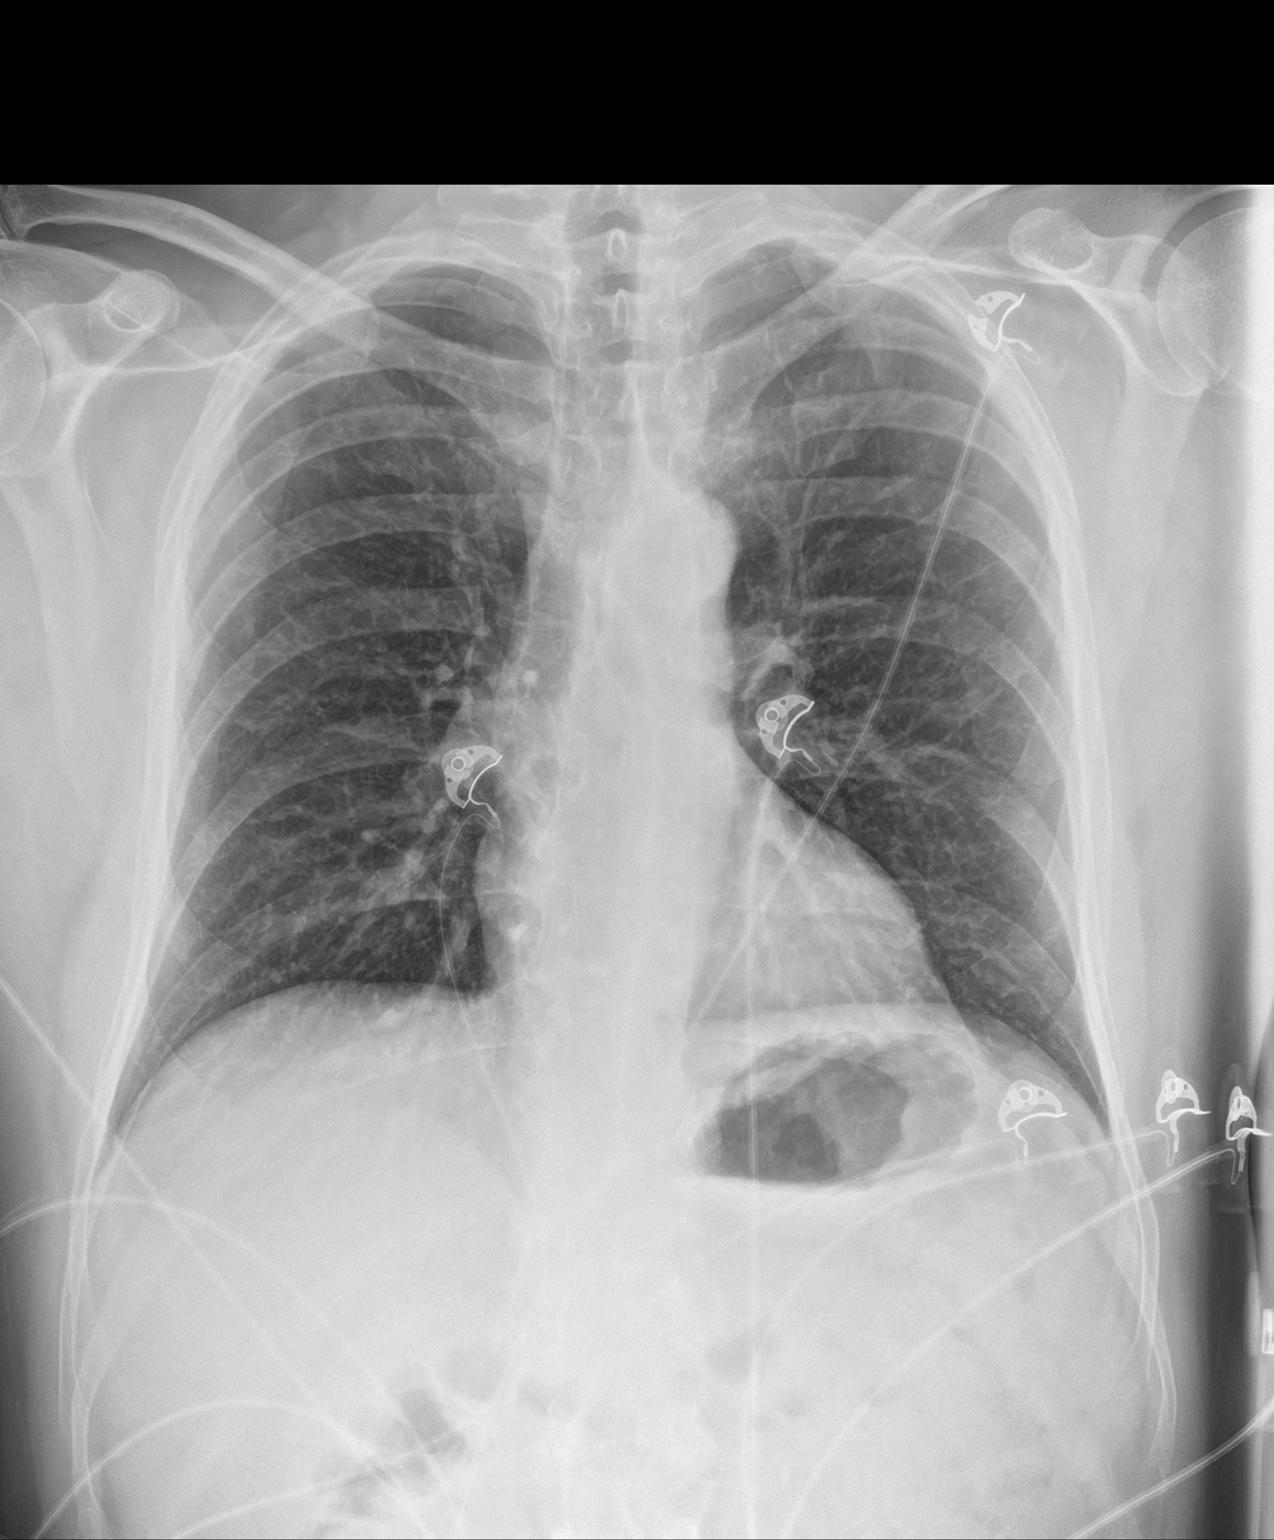
[im 2/2]
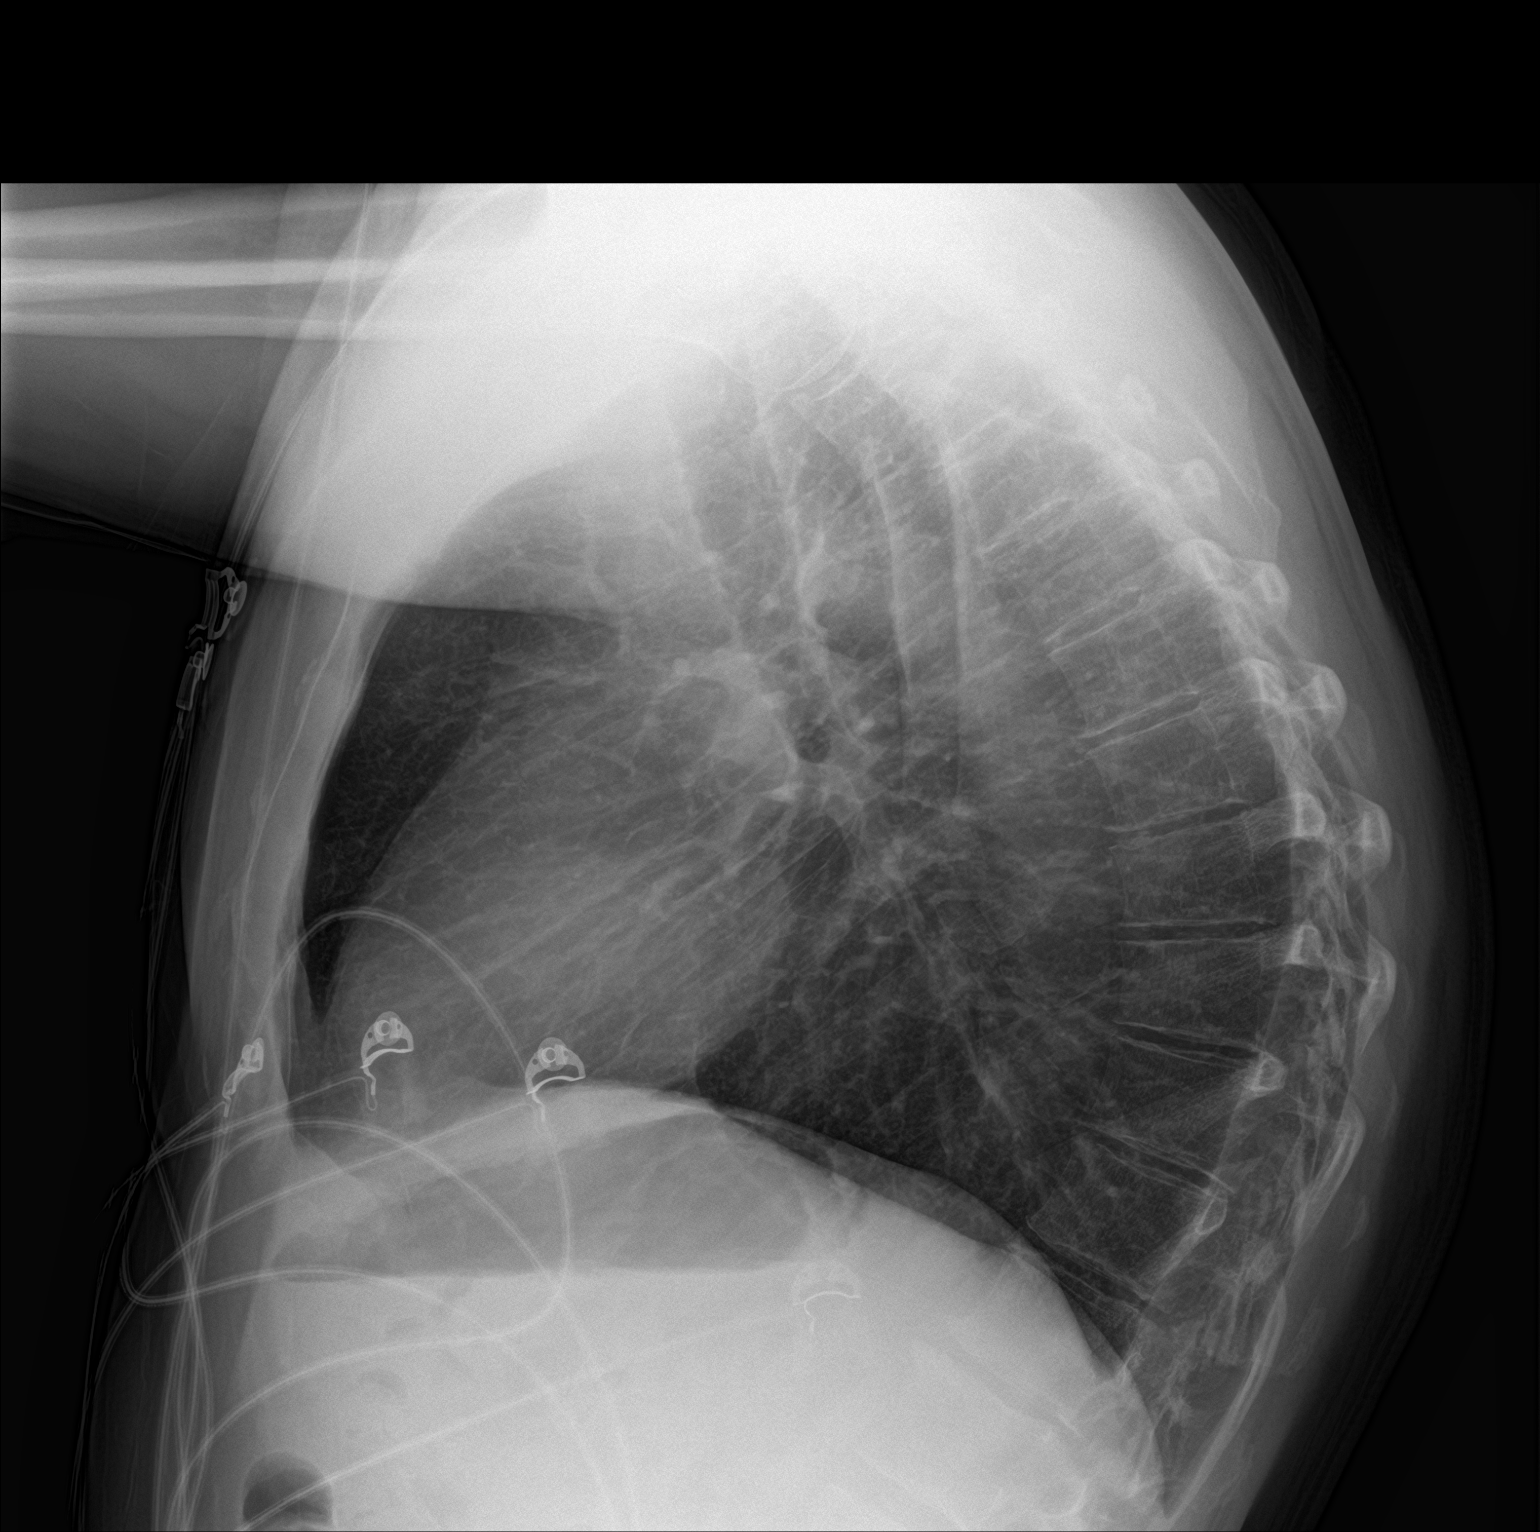

[2 of 2 positions shown; findings below may reference images not displayed]

FINDINGS: Heart and mediastinal contours are within normal limits. No focal
opacities or effusions. No acute bony abnormality.
IMPRESSION: No active cardiopulmonary disease.

## 2019-10-24 ENCOUNTER — Other Ambulatory Visit: Payer: Self-pay | Admitting: Internal Medicine

## 2019-11-01 ENCOUNTER — Other Ambulatory Visit: Payer: Self-pay | Admitting: Internal Medicine

## 2020-03-17 DIAGNOSIS — Z20822 Contact with and (suspected) exposure to covid-19: Secondary | ICD-10-CM | POA: Diagnosis not present

## 2020-03-21 ENCOUNTER — Encounter: Payer: Self-pay | Admitting: Family Medicine

## 2020-03-21 ENCOUNTER — Ambulatory Visit (INDEPENDENT_AMBULATORY_CARE_PROVIDER_SITE_OTHER): Payer: BLUE CROSS/BLUE SHIELD | Admitting: Family Medicine

## 2020-03-21 VITALS — BP 140/80 | HR 109 | Temp 97.6°F | Wt 176.5 lb

## 2020-03-21 DIAGNOSIS — J069 Acute upper respiratory infection, unspecified: Secondary | ICD-10-CM

## 2020-03-21 DIAGNOSIS — R059 Cough, unspecified: Secondary | ICD-10-CM | POA: Diagnosis not present

## 2020-03-21 DIAGNOSIS — R0989 Other specified symptoms and signs involving the circulatory and respiratory systems: Secondary | ICD-10-CM

## 2020-03-21 LAB — POC COVID19 BINAXNOW: SARS Coronavirus 2 Ag: NEGATIVE

## 2020-03-21 NOTE — Assessment & Plan Note (Signed)
Patient has been out of work for 10 days with suspected covid 19, runny nose, sore throat and fatigue. He has not had a fever that he recalls. His covid 19 was negative today.  Plan- Flonase, Zyrtec for symptom relief fu on Monday if not improved.

## 2020-03-21 NOTE — Progress Notes (Signed)
Established Patient Office Visit  SUBJECTIVE:  Subjective  Patient ID: Antonio Parks, male    DOB: Mar 19, 1965  Age: 55 y.o. MRN: [id number]  CC:  Chief Complaint  Patient presents with  . sick    Sick- cough, runny nose, SOB, congestion x 1.5 weeks    HPI [name] is a 55 y.o. male presenting today for     Past Medical History:  Diagnosis Date  . Asthma    AS A CHILD-NO INHALERS  . Elevated blood pressure reading    NO MEDS  . GERD (gastroesophageal reflux disease)    TUMS OCC    Past Surgical History:  Procedure Laterality Date  . APPENDECTOMY  as child  . FISTULOTOMY N/A 03/22/2017   Procedure: FISTULOTOMY;  Surgeon: Earline Mayotte, MD;  Location: [facility] ORS;  Service: General;  Laterality: N/A;    Family History  Problem Relation Age of Onset  . Skin cancer Mother   . Bone cancer Maternal Aunt     Social History   Socioeconomic History  . Marital status: Single    Spouse name: Not on file  . Number of children: Not on file  . Years of education: Not on file  . Highest education level: Not on file  Occupational History  . Not on file  Tobacco Use  . Smoking status: Never Smoker  . Smokeless tobacco: Current User    Types: Chew  Vaping Use  . Vaping Use: Never used  Substance and Sexual Activity  . Alcohol use: Yes    Comment: 4-5 beer per day  . Drug use: No  . Sexual activity: Not on file  Other Topics Concern  . Not on file  Social History Narrative  . Not on file   Social Determinants of Health   Financial Resource Strain: Not on file  Food Insecurity: Not on file  Transportation Needs: Not on file  Physical Activity: Not on file  Stress: Not on file  Social Connections: Not on file  Intimate Partner Violence: Not on file     Current Outpatient Medications:  .  enalapril (VASOTEC) 20 MG tablet, TAKE 1 TABLET BY MOUTH ONCE A DAY, Disp: 90 tablet, Rfl: 3 .  ibuprofen (ADVIL,MOTRIN) 200 MG tablet, Take 200 mg by  mouth every 6 (six) hours as needed., Disp: , Rfl:    Allergies  Allergen Reactions  . Penicillins Rash    Has patient had a PCN reaction causing immediate rash, facial/tongue/throat swelling, SOB or lightheadedness with hypotension: Yes Has patient had a PCN reaction causing severe rash involving mucus membranes or skin necrosis: No Has patient had a PCN reaction that required hospitalization: No Has patient had a PCN reaction occurring within the last 10 years: No If all of the above answers are "NO", then may proceed with Cephalosporin use.     ROS Review of Systems  Constitutional: Positive for fatigue.  HENT: Positive for rhinorrhea, sneezing and sore throat.   Eyes: Negative.   Respiratory: Negative.   Cardiovascular: Negative.   Genitourinary: Negative.   Hematological: Negative.   Psychiatric/Behavioral: Negative.      OBJECTIVE:    Physical Exam Constitutional:      Appearance: He is obese.  HENT:     Right Ear: Tympanic membrane normal.     Nose: Nose normal.     Mouth/Throat:     Mouth: Mucous membranes are moist.     Pharynx: Oropharynx is clear.  Cardiovascular:  Rate and Rhythm: Normal rate and regular rhythm.  Musculoskeletal:        General: Normal range of motion.     Cervical back: Normal range of motion.  Skin:    General: Skin is warm.  Neurological:     Mental Status: He is alert.  Psychiatric:        Mood and Affect: Mood normal.     BP 140/80   Pulse (!) 109   Temp 97.6 F (36.4 C)   Wt 176 lb 8 oz (80.1 kg)   SpO2 99%   BMI 24.62 kg/m  Wt Readings from Last 3 Encounters:  03/21/20 176 lb 8 oz (80.1 kg)  02/01/18 165 lb (74.8 kg)  07/29/17 168 lb (76.2 kg)    Health Maintenance Due  Topic Date Due  . Hepatitis C Screening  Never done  . COVID-19 Vaccine (1) Never done  . HIV Screening  Never done  . COLONOSCOPY (Pts 45-73yrs Insurance coverage will need to be confirmed)  Never done  . INFLUENZA VACCINE  Never done     There are no preventive care reminders to display for this patient.  CBC Latest Ref Rng & Units 02/01/2018 03/22/2017 12/24/2016  WBC 4.0 - 10.5 K/uL 8.7 6.9 17.4(H)  Hemoglobin 13.0 - 17.0 g/dL 16.0 73.7 12.8(L)  Hematocrit 39.0 - 52.0 % 41.8 44.9 37.5(L)  Platelets 150 - 400 K/uL 209 176 472(H)   CMP Latest Ref Rng & Units 02/01/2018 03/22/2017 12/24/2016  Glucose 70 - 99 mg/dL 106(Y) 694(W) 546(E)  BUN 6 - 20 mg/dL 14 11 9   Creatinine 0.61 - 1.24 mg/dL 7.03 5.00  Sodium 135 - 145 mmol/L 136 138 129(L)  Potassium 3.5 - 5.1 mmol/L 3.8 3.8 3.2(L)  Chloride 98 - 111 mmol/L 100 103 92(L)  CO2 22 - 32 mmol/L 27 25 27   Calcium 8.9 - 10.3 mg/dL 9.3 9.0 9.38)  Total Protein 6.5 - 8.1 g/dL - - 7.1  Total Bilirubin 0.3 - 1.2 mg/dL - - 0.8  Alkaline Phos 38 - 126 U/L - - 85  AST 15 - 41 U/L - - 20  ALT 17 - 63 U/L - - 24    Lab Results  Component Value Date   TSH 0.771 06/07/2013   Lab Results  Component Value Date   ALBUMIN 2.9 (L) 12/24/2016   ANIONGAP 9 02/01/2018   No results found for: CHOL, HDL, LDLCALC, CHOLHDL No results found for: TRIG No results found for: HGBA1C    ASSESSMENT & PLAN:   Problem List Items Addressed This Visit      Respiratory   Upper respiratory virus - Primary    Patient has been out of work for 10 days with suspected covid 19, runny nose, sore throat and fatigue. He has not had a fever that he recalls. His covid 19 was negative today.  Plan- Flonase, Zyrtec for symptom relief fu on Monday if not improved.        Other Visit Diagnoses    Runny nose       Relevant Orders   POC COVID-19 BinaxNow (Completed)   Cough       Relevant Orders   POC COVID-19 BinaxNow (Completed)      No orders of the defined types were placed in this encounter.     Follow-up: No follow-ups on file.    02/03/2018, FNP [facility] [address] [place], [address] [place]

## 2020-10-15 ENCOUNTER — Other Ambulatory Visit: Payer: Self-pay | Admitting: Internal Medicine

## 2021-01-24 DIAGNOSIS — F10929 Alcohol use, unspecified with intoxication, unspecified: Secondary | ICD-10-CM | POA: Diagnosis not present

## 2021-01-24 DIAGNOSIS — R000 Tachycardia, unspecified: Secondary | ICD-10-CM | POA: Diagnosis not present

## 2021-09-30 ENCOUNTER — Other Ambulatory Visit: Payer: Self-pay | Admitting: Internal Medicine

## 2021-10-02 ENCOUNTER — Other Ambulatory Visit: Payer: Self-pay | Admitting: Internal Medicine
